# Patient Record
Sex: Male | Born: 1940 | Race: White | Hispanic: No | Marital: Married | Smoking: Former smoker
Health system: Southern US, Community
[De-identification: ages and names within clinical notes are randomized; demographics above are authoritative.]

## PROBLEM LIST (undated history)

## (undated) DIAGNOSIS — K8020 Calculus of gallbladder without cholecystitis without obstruction: Secondary | ICD-10-CM

## (undated) DIAGNOSIS — E785 Hyperlipidemia, unspecified: Secondary | ICD-10-CM

## (undated) DIAGNOSIS — N289 Disorder of kidney and ureter, unspecified: Secondary | ICD-10-CM

## (undated) DIAGNOSIS — T8859XA Other complications of anesthesia, initial encounter: Secondary | ICD-10-CM

## (undated) DIAGNOSIS — R809 Proteinuria, unspecified: Secondary | ICD-10-CM

## (undated) DIAGNOSIS — Z8619 Personal history of other infectious and parasitic diseases: Secondary | ICD-10-CM

## (undated) DIAGNOSIS — T4145XA Adverse effect of unspecified anesthetic, initial encounter: Secondary | ICD-10-CM

## (undated) DIAGNOSIS — I219 Acute myocardial infarction, unspecified: Secondary | ICD-10-CM

## (undated) DIAGNOSIS — N400 Enlarged prostate without lower urinary tract symptoms: Secondary | ICD-10-CM

## (undated) DIAGNOSIS — D649 Anemia, unspecified: Secondary | ICD-10-CM

## (undated) DIAGNOSIS — I779 Disorder of arteries and arterioles, unspecified: Secondary | ICD-10-CM

## (undated) DIAGNOSIS — I10 Essential (primary) hypertension: Secondary | ICD-10-CM

## (undated) DIAGNOSIS — I2510 Atherosclerotic heart disease of native coronary artery without angina pectoris: Secondary | ICD-10-CM

## (undated) DIAGNOSIS — I714 Abdominal aortic aneurysm, without rupture, unspecified: Secondary | ICD-10-CM

## (undated) DIAGNOSIS — E559 Vitamin D deficiency, unspecified: Secondary | ICD-10-CM

## (undated) DIAGNOSIS — M109 Gout, unspecified: Secondary | ICD-10-CM

## (undated) DIAGNOSIS — M722 Plantar fascial fibromatosis: Secondary | ICD-10-CM

## (undated) DIAGNOSIS — I739 Peripheral vascular disease, unspecified: Secondary | ICD-10-CM

## (undated) DIAGNOSIS — K219 Gastro-esophageal reflux disease without esophagitis: Secondary | ICD-10-CM

## (undated) DIAGNOSIS — M2020 Hallux rigidus, unspecified foot: Secondary | ICD-10-CM

## (undated) HISTORY — PX: EYE SURGERY: SHX253

## (undated) HISTORY — PX: CAROTID ENDARTERECTOMY: SUR193

## (undated) HISTORY — PX: FEMORAL ARTERY - FEMORAL ARTERY BYPASS GRAFT: SUR179

## (undated) HISTORY — PX: CORONARY ARTERY BYPASS GRAFT: SHX141

## (undated) HISTORY — PX: COLONOSCOPY, ESOPHAGOGASTRODUODENOSCOPY (EGD) AND ESOPHAGEAL DILATION: SHX5781

## (undated) HISTORY — PX: OTHER SURGICAL HISTORY: SHX169

## (undated) HISTORY — PX: COLONOSCOPY: SHX174

## (undated) HISTORY — PX: CORONARY ANGIOPLASTY: SHX604

---
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
## 2005-01-21 ENCOUNTER — Other Ambulatory Visit: Payer: Self-pay

## 2005-02-16 ENCOUNTER — Ambulatory Visit: Payer: Self-pay | Admitting: Otolaryngology

## 2006-08-22 ENCOUNTER — Ambulatory Visit: Payer: Self-pay | Admitting: Internal Medicine

## 2007-01-03 ENCOUNTER — Ambulatory Visit: Payer: Self-pay | Admitting: Gastroenterology

## 2007-09-19 ENCOUNTER — Ambulatory Visit: Payer: Self-pay | Admitting: Internal Medicine

## 2007-10-31 ENCOUNTER — Ambulatory Visit: Payer: Self-pay | Admitting: Unknown Physician Specialty

## 2007-12-02 ENCOUNTER — Emergency Department: Payer: Self-pay | Admitting: Unknown Physician Specialty

## 2007-12-02 ENCOUNTER — Other Ambulatory Visit: Payer: Self-pay

## 2009-10-28 ENCOUNTER — Ambulatory Visit: Payer: Self-pay | Admitting: Otolaryngology

## 2009-12-24 ENCOUNTER — Ambulatory Visit: Payer: Self-pay | Admitting: Internal Medicine

## 2010-02-10 ENCOUNTER — Ambulatory Visit: Payer: Self-pay | Admitting: Unknown Physician Specialty

## 2010-03-21 HISTORY — PX: CORONARY ARTERY BYPASS GRAFT: SHX141

## 2010-04-30 ENCOUNTER — Ambulatory Visit: Payer: Self-pay | Admitting: Vascular Surgery

## 2010-12-22 ENCOUNTER — Ambulatory Visit: Payer: Self-pay | Admitting: Cardiology

## 2011-03-11 ENCOUNTER — Ambulatory Visit: Payer: Self-pay | Admitting: Vascular Surgery

## 2011-03-18 ENCOUNTER — Inpatient Hospital Stay: Payer: Self-pay | Admitting: Vascular Surgery

## 2011-03-21 LAB — PATHOLOGY REPORT

## 2013-12-11 DIAGNOSIS — M722 Plantar fascial fibromatosis: Secondary | ICD-10-CM | POA: Insufficient documentation

## 2013-12-11 DIAGNOSIS — M109 Gout, unspecified: Secondary | ICD-10-CM | POA: Insufficient documentation

## 2013-12-11 DIAGNOSIS — E559 Vitamin D deficiency, unspecified: Secondary | ICD-10-CM | POA: Insufficient documentation

## 2013-12-11 DIAGNOSIS — I10 Essential (primary) hypertension: Secondary | ICD-10-CM | POA: Insufficient documentation

## 2013-12-11 DIAGNOSIS — I2510 Atherosclerotic heart disease of native coronary artery without angina pectoris: Secondary | ICD-10-CM | POA: Diagnosis present

## 2013-12-11 DIAGNOSIS — R809 Proteinuria, unspecified: Secondary | ICD-10-CM | POA: Insufficient documentation

## 2013-12-11 DIAGNOSIS — E785 Hyperlipidemia, unspecified: Secondary | ICD-10-CM | POA: Insufficient documentation

## 2013-12-11 DIAGNOSIS — N400 Enlarged prostate without lower urinary tract symptoms: Secondary | ICD-10-CM | POA: Insufficient documentation

## 2014-07-13 NOTE — Op Note (Signed)
PATIENT NAME:  Cory Howard, Cory Howard MR#:  [id number] DATE OF BIRTH:  1940/12/03  DATE OF PROCEDURE:  03/18/2011  PREOPERATIVE DIAGNOSES:  1. 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  2. Coronary artery disease, status post coronary bypass grafting.   POSTOPERATIVE DIAGNOSES: 1. 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  2. Coronary artery disease, status post coronary bypass grafting.   PROCEDURE: Left carotid endarterectomy.   SURGEON: Algernon Huxley, MD   ANESTHESIA: General.   ESTIMATED BLOOD LOSS: 25 mL.   INDICATION FOR PROCEDURE: The patient is a 74 year old white male with carotid artery stenosis. He has history of amaurosis fugax. He has had worsening ulcer and significant stenosis in the 70 to 75% range. With his previous history of symptoms, endarterectomy was discussed. He was found to have coronary disease and he has undergone coronary artery bypass grafting. He is now recovered from this and ready for surgery. Risks and benefits are discussed. Informed consent was obtained.   DESCRIPTION OF PROCEDURE: The patient was brought to the operative suite. After an adequate level of general anesthesia was obtained, he was placed in modified beach chair position. Roll was placed under his shoulders, neck was flexed and head turned to the right. The area was sterilely prepped and draped and a sterile surgical field was created. The incision was created along the anterior border of the sternocleidomastoid and we dissected down through the platysma with electrocautery. The facial vein was identified, ligated between silk ties as were several small branches. The artery was identified and was a generous size. The bifurcation was at the standard level but the disease did track several centimeters higher so we had to dissect out the hypoglossal nerve, remove all the surrounding vascular structures, ligate and divide those between silk ties to protect the hypoglossal  nerve. The common carotid artery, external carotid artery, superior thyroid artery, and internal carotid artery distal lesion were all dissected out and circled with vessel loops. The patient was given 6000 units of intravenous heparin systemic anticoagulation. Control was then pulled up on the vessel loops and an arteriotomy was created with an 11 blade and extended with Potts scissors. A Pruitt-Inahara shunt was then placed first in the internal carotid artery, flushed and then the common carotid artery flushed and de-aired and then flow was restored after approximately two minutes of clamp time. Endarterectomy was then performed in the usual fashion. The proximal endpoint was cut flush with tenotomy scissors. An eversion endarterectomy was performed in the external carotid artery and a nice feathered distal endpoint was created with blunt traction in the internal carotid artery. The distal endpoint was tacked down with two 7-0 Prolene sutures and all loose flecks were removed. The vessel was copiously heparinized locally. Due to the generous size of the artery, I elected to perform a primary closure. The distal endpoint was started first with a 6-0 Prolene suture, run to the midportion arteriotomy. The proximal endpoint was then started with 6-0 Prolene suture and run approximately one-quarter the length of the arteriotomy. At this point, the Pruitt-Inahara shunt was removed. The vessel was flushed in the external, internal, and common carotid arteries and heparinized locally and then the arteriotomy was completed flushing through the external carotid artery prior to tying the suture line down. Two 6-0 Prolene patch sutures were used for hemostasis. Several cardiac cycles were allowed to traverse up the external carotid artery before internal carotid artery control was released. On release of control, there was a  nice pulse in the distal internal carotid artery. The wound was copiously irrigated. Surgicel and  Evicyl topical hemostatic agents were placed and hemostasis was complete. The wound was closed with three interrupted 3-0 Vicryl sutures in the sternocleidomastoid space. Platysma was closed with a running 3-0 Vicryl. The skin was closed with 4-0 Monocryl. Sterile dressing was placed. The patient tolerated the procedure well and was taken to the recovery room in stable condition.   ____________________________ Algernon Huxley, MD jsd:drc D: 03/18/2011 16:13:53 ET T: 03/19/2011 10:08:04 ET JOB#: [id number]  cc: Algernon Huxley, MD, <Dictator> Javier Docker. Ubaldo Glassing, [place] Sarina Ser, MD Algernon Huxley MD ELECTRONICALLY SIGNED 04/08/2011 7:55

## 2015-02-19 DIAGNOSIS — Z86010 Personal history of colonic polyps: Secondary | ICD-10-CM | POA: Insufficient documentation

## 2015-03-19 ENCOUNTER — Encounter: Payer: Self-pay | Admitting: *Deleted

## 2015-03-20 ENCOUNTER — Ambulatory Visit: Payer: Medicare Other | Admitting: Certified Registered Nurse Anesthetist

## 2015-03-20 ENCOUNTER — Encounter: Payer: Self-pay | Admitting: *Deleted

## 2015-03-20 ENCOUNTER — Ambulatory Visit
Admission: RE | Admit: 2015-03-20 | Discharge: 2015-03-20 | Disposition: A | Discharge status: Home / Self Care | Payer: Medicare Other | Source: Ambulatory Visit | Attending: Unknown Physician Specialty | Admitting: Unknown Physician Specialty

## 2015-03-20 ENCOUNTER — Encounter
Admission: RE | Disposition: A | Discharge status: Home / Self Care | Payer: Self-pay | Source: Ambulatory Visit | Attending: Unknown Physician Specialty

## 2015-03-20 DIAGNOSIS — N289 Disorder of kidney and ureter, unspecified: Secondary | ICD-10-CM | POA: Diagnosis not present

## 2015-03-20 DIAGNOSIS — N400 Enlarged prostate without lower urinary tract symptoms: Secondary | ICD-10-CM | POA: Insufficient documentation

## 2015-03-20 DIAGNOSIS — D124 Benign neoplasm of descending colon: Secondary | ICD-10-CM | POA: Insufficient documentation

## 2015-03-20 DIAGNOSIS — I739 Peripheral vascular disease, unspecified: Secondary | ICD-10-CM | POA: Diagnosis not present

## 2015-03-20 DIAGNOSIS — E559 Vitamin D deficiency, unspecified: Secondary | ICD-10-CM | POA: Insufficient documentation

## 2015-03-20 DIAGNOSIS — D125 Benign neoplasm of sigmoid colon: Secondary | ICD-10-CM | POA: Diagnosis not present

## 2015-03-20 DIAGNOSIS — Z7982 Long term (current) use of aspirin: Secondary | ICD-10-CM | POA: Insufficient documentation

## 2015-03-20 DIAGNOSIS — Z79899 Other long term (current) drug therapy: Secondary | ICD-10-CM | POA: Diagnosis not present

## 2015-03-20 DIAGNOSIS — Z8249 Family history of ischemic heart disease and other diseases of the circulatory system: Secondary | ICD-10-CM | POA: Diagnosis not present

## 2015-03-20 DIAGNOSIS — I10 Essential (primary) hypertension: Secondary | ICD-10-CM | POA: Insufficient documentation

## 2015-03-20 DIAGNOSIS — Z86010 Personal history of colonic polyps: Secondary | ICD-10-CM | POA: Diagnosis present

## 2015-03-20 DIAGNOSIS — Z951 Presence of aortocoronary bypass graft: Secondary | ICD-10-CM | POA: Insufficient documentation

## 2015-03-20 DIAGNOSIS — Z87891 Personal history of nicotine dependence: Secondary | ICD-10-CM | POA: Insufficient documentation

## 2015-03-20 DIAGNOSIS — D123 Benign neoplasm of transverse colon: Secondary | ICD-10-CM | POA: Diagnosis not present

## 2015-03-20 DIAGNOSIS — Z801 Family history of malignant neoplasm of trachea, bronchus and lung: Secondary | ICD-10-CM | POA: Insufficient documentation

## 2015-03-20 DIAGNOSIS — Z885 Allergy status to narcotic agent status: Secondary | ICD-10-CM | POA: Diagnosis not present

## 2015-03-20 DIAGNOSIS — M109 Gout, unspecified: Secondary | ICD-10-CM | POA: Insufficient documentation

## 2015-03-20 DIAGNOSIS — R809 Proteinuria, unspecified: Secondary | ICD-10-CM | POA: Insufficient documentation

## 2015-03-20 DIAGNOSIS — I2510 Atherosclerotic heart disease of native coronary artery without angina pectoris: Secondary | ICD-10-CM | POA: Insufficient documentation

## 2015-03-20 DIAGNOSIS — E785 Hyperlipidemia, unspecified: Secondary | ICD-10-CM | POA: Diagnosis not present

## 2015-03-20 DIAGNOSIS — M722 Plantar fascial fibromatosis: Secondary | ICD-10-CM | POA: Insufficient documentation

## 2015-03-20 HISTORY — DX: Hyperlipidemia, unspecified: E78.5

## 2015-03-20 HISTORY — PX: COLONOSCOPY WITH PROPOFOL: SHX5780

## 2015-03-20 HISTORY — DX: Peripheral vascular disease, unspecified: I73.9

## 2015-03-20 HISTORY — DX: Vitamin D deficiency, unspecified: E55.9

## 2015-03-20 HISTORY — DX: Benign prostatic hyperplasia without lower urinary tract symptoms: N40.0

## 2015-03-20 HISTORY — DX: Gout, unspecified: M10.9

## 2015-03-20 HISTORY — DX: Proteinuria, unspecified: R80.9

## 2015-03-20 HISTORY — DX: Disorder of kidney and ureter, unspecified: N28.9

## 2015-03-20 HISTORY — DX: Atherosclerotic heart disease of native coronary artery without angina pectoris: I25.10

## 2015-03-20 HISTORY — DX: Disorder of arteries and arterioles, unspecified: I77.9

## 2015-03-20 HISTORY — DX: Essential (primary) hypertension: I10

## 2015-03-20 SURGERY — COLONOSCOPY WITH PROPOFOL
Anesthesia: General

## 2015-03-20 MED ORDER — SODIUM CHLORIDE 0.9 % IV SOLN: INTRAVENOUS | Status: DC

## 2015-03-20 MED ORDER — SODIUM CHLORIDE 0.9 % IV SOLN
INTRAVENOUS | Status: DC
  Administered 2015-03-20: 09:00:00 via INTRAVENOUS

## 2015-03-20 MED ORDER — PROPOFOL 10 MG/ML IV BOLUS
INTRAVENOUS | Status: DC | PRN
  Administered 2015-03-20: 30 mg via INTRAVENOUS

## 2015-03-20 MED ORDER — MIDAZOLAM HCL 2 MG/2ML IJ SOLN
INTRAMUSCULAR | Status: DC | PRN
  Administered 2015-03-20: 1 mg via INTRAVENOUS

## 2015-03-20 MED ORDER — PROPOFOL 500 MG/50ML IV EMUL
INTRAVENOUS | Status: DC | PRN
  Administered 2015-03-20: 140 ug/kg/min via INTRAVENOUS

## 2015-03-20 NOTE — Op Note (Signed)
[facility] Patient Name: Cory Howard Procedure Date: 03/20/2015 8:59 AM MRN: [id number] Account #: [id number] Date of Birth: 11-12-1940 Admit Type: Outpatient Age: 74 Room: [facility] ENDO ROOM 4 Gender: Male Note Status: Finalized Procedure:         Colonoscopy Indications:       High risk colon cancer surveillance: Personal history of                     colonic polyps Providers:         Manya Silvas, MD Referring MD:      Hewitt Blade. Sarina Ser, MD (Referring MD) Medicines:         Propofol per Anesthesia Complications:     No immediate complications. Procedure:         Pre-Anesthesia Assessment:                    - After reviewing the risks and benefits, the patient was                     deemed in satisfactory condition to undergo the procedure.                    After obtaining informed consent, the colonoscope was                     passed under direct vision. Throughout the procedure, the                     patient's blood pressure, pulse, and oxygen saturations                     were monitored continuously. The Colonoscope was                     introduced through the anus and advanced to the the cecum,                     identified by appendiceal orifice and ileocecal valve. The                     colonoscopy was performed without difficulty. The patient                     tolerated the procedure well. The quality of the bowel                     preparation was excellent. Findings:      A diminutive polyp was found in the sigmoid colon. The polyp was       sessile. The polyp was removed with a cold biopsy forceps. Resection and       retrieval were complete.      A diminutive polyp was found in the transverse colon. The polyp was       sessile. The polyp was removed with a cold biopsy forceps. Resection and       retrieval were complete.      Two sessile polyps were found in the descending colon. The polyps were   diminutive in size. These polyps were removed with a cold biopsy       forceps. Resection and retrieval were complete.      Two sessile polyps were found in the sigmoid colon. The polyps were       diminutive in  size. These polyps were removed with a cold biopsy       forceps. Resection and retrieval were complete.      The exam was otherwise without abnormality. Impression:        - One diminutive polyp in the sigmoid colon. Resected and                     retrieved.                    - One diminutive polyp in the transverse colon. Resected                     and retrieved.                    - Two diminutive polyps in the descending colon. Resected                     and retrieved.                    - Two diminutive polyps in the sigmoid colon. Resected and                     retrieved.                    - The examination was otherwise normal. Recommendation:    - Await pathology results. Manya Silvas, MD 03/20/2015 9:35:39 AM This report has been signed electronically. Number of Addenda: 0 Note Initiated On: 03/20/2015 8:59 AM Scope Withdrawal Time: 0 hours 12 minutes 32 seconds  Total Procedure Duration: 0 hours 19 minutes 40 seconds       [facility]

## 2015-03-20 NOTE — Anesthesia Procedure Notes (Signed)
Date/Time: 03/20/2015 9:06 AM Performed by: Johnna Acosta Pre-anesthesia Checklist: Patient identified, Emergency Drugs available, Suction available, Patient being monitored and Timeout performed Patient Re-evaluated:Patient Re-evaluated prior to inductionOxygen Delivery Method: Nasal cannula

## 2015-03-20 NOTE — Anesthesia Preprocedure Evaluation (Signed)
Anesthesia Evaluation  Patient identified by MRN, date of birth, ID band Patient awake    Reviewed: Allergy & Precautions, H&P , NPO status , Patient's Chart, lab work & pertinent test results  History of Anesthesia Complications (+) AWARENESS UNDER ANESTHESIA and history of anesthetic complications  Airway Mallampati: III  TM Distance: >3 FB Neck ROM: limited    Dental no notable dental hx. (+) Teeth Intact   Pulmonary neg shortness of breath, former smoker,    Pulmonary exam normal breath sounds clear to auscultation       Cardiovascular Exercise Tolerance: Good hypertension, (-) angina+ CAD, + CABG and + Peripheral Vascular Disease  (-) DOE Normal cardiovascular exam Rhythm:regular Rate:Normal     Neuro/Psych negative neurological ROS  negative psych ROS   GI/Hepatic negative GI ROS, Neg liver ROS,   Endo/Other  negative endocrine ROS  Renal/GU Renal disease  negative genitourinary   Musculoskeletal   Abdominal   Peds  Hematology negative hematology ROS (+)   Anesthesia Other Findings Past Medical History:   Hypertension                                                 Lipids serum increased                                       Gout                                                         Peripheral vascular disease (HCC)                            Coronary artery disease                                      BPH (benign prostatic hyperplasia)                           Renal insufficiency                                          Vitamin D deficiency                                         Gout                                                         Urine protein increased  Carotid arterial disease (HCC)                              Past Surgical History:   ADENATOMOUS COLON POLYP                                       FEMORAL ARTERY - FEMORAL ARTERY BYPASS GRAFT                   CORONARY ARTERY BYPASS GRAFT                                  CORONARY ANGIOPLASTY                                          COLONOSCOPY, ESOPHAGOGASTRODUODENOSCOPY (EGD) *               COLONOSCOPY                                                  BMI    Body Mass Index   29.96 kg/m 2    Patient has cardiac clearance for this procedure.     Reproductive/Obstetrics negative OB ROS                             Anesthesia Physical Anesthesia Plan  ASA: III  Anesthesia Plan: General   Post-op Pain Management:    Induction:   Airway Management Planned:   Additional Equipment:   Intra-op Plan:   Post-operative Plan:   Informed Consent: I have reviewed the patients History and Physical, chart, labs and discussed the procedure including the risks, benefits and alternatives for the proposed anesthesia with the patient or authorized representative who has indicated his/her understanding and acceptance.   Dental Advisory Given  Plan Discussed with: Anesthesiologist, CRNA and Surgeon  Anesthesia Plan Comments:         Anesthesia Quick Evaluation

## 2015-03-20 NOTE — Transfer of Care (Signed)
Immediate Anesthesia Transfer of Care Note  Patient: Cory Howard  Procedure(s) Performed: Procedure(s): COLONOSCOPY WITH PROPOFOL (N/A)  Patient Location: PACU  Anesthesia Type:General  Level of Consciousness: sedated  Airway & Oxygen Therapy: Patient Spontanous Breathing and Patient connected to nasal cannula oxygen  Post-op Assessment: Report given to RN and Post -op Vital signs reviewed and stable  Post vital signs: Reviewed and stable  Last Vitals:  Filed Vitals:   03/20/15 0834  BP: 154/80  Pulse: 61  Temp: 36.3 C  Resp: 18    Complications: No apparent anesthesia complications

## 2015-03-20 NOTE — Anesthesia Postprocedure Evaluation (Signed)
Anesthesia Post Note  Patient: Joshual Morale Coker  Procedure(s) Performed: Procedure(s) (LRB): COLONOSCOPY WITH PROPOFOL (N/A)  Patient location during evaluation: Endoscopy Anesthesia Type: General Level of consciousness: awake and alert Pain management: pain level controlled Vital Signs Assessment: post-procedure vital signs reviewed and stable Respiratory status: spontaneous breathing, nonlabored ventilation, respiratory function stable and patient connected to nasal cannula oxygen Cardiovascular status: blood pressure returned to baseline and stable Postop Assessment: no signs of nausea or vomiting Anesthetic complications: no    Last Vitals:  Filed Vitals:   03/20/15 0956 03/20/15 1006  BP: 110/82 133/76  Pulse: 53 52  Temp:    Resp: 16 15    Last Pain: There were no vitals filed for this visit.               Precious Haws Briana Newman

## 2015-03-20 NOTE — H&P (Signed)
Primary Care Physician:  Madelyn Brunner, MD Primary Gastroenterologist:  Dr. Vira Agar  Pre-Procedure History & Physical: HPI:  Cory Howard is a 74 y.o. male is here for an colonoscopy.   Past Medical History  Diagnosis Date  . Hypertension   . Lipids serum increased   . Gout   . Peripheral vascular disease ([facility])   . Coronary artery disease   . BPH (benign prostatic hyperplasia)   . Renal insufficiency   . Vitamin D deficiency   . Gout   . Urine protein increased   . Carotid arterial disease [facility])     Past Surgical History  Procedure Laterality Date  . Adenatomous colon polyp    . Femoral artery - femoral artery bypass graft    . Coronary artery bypass graft    . Coronary angioplasty    . Colonoscopy, esophagogastroduodenoscopy (egd) and esophageal dilation    . Colonoscopy      Prior to Admission medications   Medication Sig Start Date End Date Taking? Authorizing Provider  allopurinol (ZYLOPRIM) 300 MG tablet Take 300 mg by mouth daily.   Yes Historical Provider, MD  amLODipine (NORVASC) 5 MG tablet Take 5 mg by mouth daily.   Yes Historical Provider, MD  aspirin EC 81 MG tablet Take 81 mg by mouth daily.   Yes Historical Provider, MD  metoprolol tartrate (LOPRESSOR) 25 MG tablet Take 12.5 mg by mouth 2 (two) times daily.   Yes Historical Provider, MD  omega-3 acid ethyl esters (LOVAZA) 1 g capsule Take by mouth 2 (two) times daily.   Yes Historical Provider, MD  sildenafil (REVATIO) 20 MG tablet Take 20 mg by mouth 3 (three) times daily.   Yes Historical Provider, MD  simvastatin (ZOCOR) 40 MG tablet Take 40 mg by mouth daily.   Yes Historical Provider, MD  tamsulosin (FLOMAX) 0.4 MG CAPS capsule Take 0.4 mg by mouth.   Yes Historical Provider, MD    Allergies as of 02/25/2015  . (Not on File)    History reviewed. No pertinent family history.  Social History   Social History  . Marital Status: Married    Spouse Name: N/A  . Number of Children: N/A   . Years of Education: N/A   Occupational History  . Not on file.   Social History Main Topics  . Smoking status: Former Research scientist (life sciences)  . Smokeless tobacco: Never Used  . Alcohol Use: No  . Drug Use: No  . Sexual Activity: Not on file   Other Topics Concern  . Not on file   Social History Narrative    Review of Systems: See HPI, otherwise negative ROS  Physical Exam: BP 154/80 mmHg  Pulse 61  Temp(Src) 97.4 F (36.3 C) (Tympanic)  Resp 18  Ht 5\' 8"  (1.727 m)  Wt 89.359 kg (197 lb)  BMI 29.96 kg/m2  SpO2 96% General:   Alert,  pleasant and cooperative in NAD Head:  Normocephalic and atraumatic. Neck:  Supple; no masses or thyromegaly. Lungs:  Clear throughout to auscultation.    Heart:  Regular rate and rhythm. Abdomen:  Soft, nontender and nondistended. Normal bowel sounds, without guarding, and without rebound.   Neurologic:  Alert and  oriented x4;  grossly normal neurologically.  Impression/Plan: Cory Howard is here for an colonoscopy to be performed for [facility] colon polyps  Risks, benefits, limitations, and alternatives regarding  colonoscopy have been reviewed with the patient.  Questions have been answered.  All parties agreeable.  Gaylyn Cheers, MD  03/20/2015, 9:00 AM

## 2015-03-24 LAB — SURGICAL PATHOLOGY

## 2015-03-25 ENCOUNTER — Encounter: Payer: Self-pay | Admitting: Unknown Physician Specialty

## 2016-02-16 ENCOUNTER — Other Ambulatory Visit: Payer: Self-pay | Admitting: Cardiology

## 2016-02-16 MED ORDER — SODIUM CHLORIDE 0.9 % WEIGHT BASED INFUSION: 1.0000 mL/kg/h | INTRAVENOUS | Status: DC

## 2016-02-16 MED ORDER — SODIUM CHLORIDE 0.9 % WEIGHT BASED INFUSION: 3.0000 mL/kg/h | INTRAVENOUS | Status: DC

## 2016-02-16 MED ORDER — SODIUM CHLORIDE 0.9 % IV SOLN: 250.0000 mL | INTRAVENOUS | Status: AC | PRN

## 2016-02-16 MED ORDER — SODIUM CHLORIDE 0.9% FLUSH: 3.0000 mL | Freq: Two times a day (BID) | INTRAVENOUS | Status: AC

## 2016-02-16 MED ORDER — SODIUM CHLORIDE 0.9% FLUSH: 3.0000 mL | INTRAVENOUS | Status: AC | PRN

## 2016-02-16 MED ORDER — ASPIRIN 81 MG PO CHEW: 81.0000 mg | CHEWABLE_TABLET | ORAL | Status: AC

## 2016-02-23 ENCOUNTER — Ambulatory Visit
Admission: RE | Admit: 2016-02-23 | Discharge: 2016-02-23 | Disposition: A | Discharge status: Home / Self Care | Payer: Medicare Other | Source: Ambulatory Visit | Attending: Cardiology | Admitting: Cardiology

## 2016-02-23 ENCOUNTER — Encounter: Payer: Self-pay | Admitting: *Deleted

## 2016-02-23 ENCOUNTER — Encounter
Admission: RE | Disposition: A | Discharge status: Home / Self Care | Payer: Self-pay | Source: Ambulatory Visit | Attending: Cardiology

## 2016-02-23 DIAGNOSIS — I739 Peripheral vascular disease, unspecified: Secondary | ICD-10-CM | POA: Insufficient documentation

## 2016-02-23 DIAGNOSIS — I2582 Chronic total occlusion of coronary artery: Secondary | ICD-10-CM | POA: Diagnosis not present

## 2016-02-23 DIAGNOSIS — Z951 Presence of aortocoronary bypass graft: Secondary | ICD-10-CM | POA: Diagnosis not present

## 2016-02-23 DIAGNOSIS — Y832 Surgical operation with anastomosis, bypass or graft as the cause of abnormal reaction of the patient, or of later complication, without mention of misadventure at the time of the procedure: Secondary | ICD-10-CM | POA: Diagnosis not present

## 2016-02-23 DIAGNOSIS — E7800 Pure hypercholesterolemia, unspecified: Secondary | ICD-10-CM | POA: Diagnosis not present

## 2016-02-23 DIAGNOSIS — M109 Gout, unspecified: Secondary | ICD-10-CM | POA: Diagnosis not present

## 2016-02-23 DIAGNOSIS — F17210 Nicotine dependence, cigarettes, uncomplicated: Secondary | ICD-10-CM | POA: Diagnosis not present

## 2016-02-23 DIAGNOSIS — T829XXA Unspecified complication of cardiac and vascular prosthetic device, implant and graft, initial encounter: Secondary | ICD-10-CM | POA: Diagnosis not present

## 2016-02-23 DIAGNOSIS — I10 Essential (primary) hypertension: Secondary | ICD-10-CM | POA: Insufficient documentation

## 2016-02-23 DIAGNOSIS — Z95820 Peripheral vascular angioplasty status with implants and grafts: Secondary | ICD-10-CM | POA: Insufficient documentation

## 2016-02-23 DIAGNOSIS — N289 Disorder of kidney and ureter, unspecified: Secondary | ICD-10-CM | POA: Insufficient documentation

## 2016-02-23 DIAGNOSIS — N5203 Combined arterial insufficiency and corporo-venous occlusive erectile dysfunction: Secondary | ICD-10-CM | POA: Diagnosis not present

## 2016-02-23 DIAGNOSIS — I2510 Atherosclerotic heart disease of native coronary artery without angina pectoris: Secondary | ICD-10-CM | POA: Diagnosis not present

## 2016-02-23 DIAGNOSIS — Z79899 Other long term (current) drug therapy: Secondary | ICD-10-CM | POA: Diagnosis not present

## 2016-02-23 DIAGNOSIS — Z7982 Long term (current) use of aspirin: Secondary | ICD-10-CM | POA: Insufficient documentation

## 2016-02-23 HISTORY — PX: CARDIAC CATHETERIZATION: SHX172

## 2016-02-23 SURGERY — LEFT HEART CATH AND CORONARY ANGIOGRAPHY
Anesthesia: Moderate Sedation

## 2016-02-23 SURGERY — LEFT HEART CATH AND CORONARY ANGIOGRAPHY
Anesthesia: Moderate Sedation | Laterality: Right

## 2016-02-23 MED ORDER — SODIUM CHLORIDE 0.9% FLUSH: 3.0000 mL | Freq: Two times a day (BID) | INTRAVENOUS | Status: DC

## 2016-02-23 MED ORDER — FENTANYL CITRATE (PF) 100 MCG/2ML IJ SOLN
INTRAMUSCULAR | Status: AC
  Filled 2016-02-23: qty 2

## 2016-02-23 MED ORDER — FENTANYL CITRATE (PF) 100 MCG/2ML IJ SOLN
INTRAMUSCULAR | Status: DC | PRN
  Administered 2016-02-23: 25 ug via INTRAVENOUS

## 2016-02-23 MED ORDER — HEPARIN (PORCINE) IN NACL 2-0.9 UNIT/ML-% IJ SOLN
INTRAMUSCULAR | Status: AC
  Filled 2016-02-23: qty 500

## 2016-02-23 MED ORDER — NITROGLYCERIN 0.4 MG SL SUBL
SUBLINGUAL_TABLET | SUBLINGUAL | Status: AC
  Administered 2016-02-23: 0.4 mg via SUBLINGUAL
  Filled 2016-02-23: qty 1

## 2016-02-23 MED ORDER — ASPIRIN 81 MG PO CHEW: 81.0000 mg | CHEWABLE_TABLET | ORAL | Status: DC

## 2016-02-23 MED ORDER — SODIUM CHLORIDE 0.9 % IV SOLN: INTRAVENOUS | Status: DC

## 2016-02-23 MED ORDER — NITROGLYCERIN 0.4 MG SL SUBL
0.4000 mg | SUBLINGUAL_TABLET | Freq: Once | SUBLINGUAL | Status: AC
  Administered 2016-02-23: 0.4 mg via SUBLINGUAL

## 2016-02-23 MED ORDER — SODIUM CHLORIDE 0.9 % IV SOLN: 250.0000 mL | INTRAVENOUS | Status: DC | PRN

## 2016-02-23 MED ORDER — MIDAZOLAM HCL 2 MG/2ML IJ SOLN
INTRAMUSCULAR | Status: DC | PRN
  Administered 2016-02-23: 1 mg via INTRAVENOUS

## 2016-02-23 MED ORDER — SODIUM CHLORIDE 0.9% FLUSH: 3.0000 mL | INTRAVENOUS | Status: DC | PRN

## 2016-02-23 MED ORDER — MIDAZOLAM HCL 2 MG/2ML IJ SOLN
INTRAMUSCULAR | Status: AC
  Filled 2016-02-23: qty 2

## 2016-02-23 MED ORDER — IOPAMIDOL (ISOVUE-300) INJECTION 61%
INTRAVENOUS | Status: DC | PRN
Start: 2016-02-23 — End: 2016-02-23
  Administered 2016-02-23: 125 mL via INTRA_ARTERIAL

## 2016-02-23 SURGICAL SUPPLY — 13 items
CATH 5FR PIGTAIL DIAGNOSTIC (CATHETERS) ×4 IMPLANT
CATH INFINITI 5 FR IM (CATHETERS) ×4 IMPLANT
CATH INFINITI 5 FR MPA2 (CATHETERS) ×4 IMPLANT
CATH INFINITI 5FR JL4 (CATHETERS) ×4 IMPLANT
CATH INFINITI 5FR JL5 (CATHETERS) ×4 IMPLANT
CATH INFINITI JR4 5F (CATHETERS) ×4 IMPLANT
DEVICE CLOSURE MYNXGRIP 5F (Vascular Products) ×4 IMPLANT
GUIDEWIRE J TIP .035 (WIRE) ×4 IMPLANT
KIT MANI 3VAL PERCEP (MISCELLANEOUS) ×4 IMPLANT
NEEDLE PERC 18GX7CM (NEEDLE) ×4 IMPLANT
PACK CARDIAC CATH (CUSTOM PROCEDURE TRAY) ×4 IMPLANT
SHEATH AVANTI 5FR X 11CM (SHEATH) ×4 IMPLANT
WIRE EMERALD 3MM-J .035X150CM (WIRE) ×4 IMPLANT

## 2016-02-23 NOTE — Progress Notes (Signed)
Patient complaint of 2/10 chest tightness upon arrival to recovery area, MD notified with no new orders. At discharge time, patient complaint of 1/10 chest tightness, MD notified with nitro order. Nitro administered with no improvement in chest tightness, but with drop in BP. MD notified and gave order ok for discharge. Patient's vital signs stabilized after nitro administration. Left groin site, WNL. Discharge instructions reviewed with patient and family.

## 2016-02-23 NOTE — H&P (Signed)
Chief Complaint: Chief Complaint  Patient presents with  . 6 month follow up  abnormal stress test  Date of Service: 02/16/2016 Date of Birth: [birth year] PCP: Madelyn Brunner, MD  History of Present Illness: [name] is a 75 y.o.male patient who has a history hypertension and hyperlipidemia. He underwent a PCI of the distal RCA in 2000. In preparation for vascular surgery, functional study showed evidence of ischemia. Cardiac catheterization revealed three-vessel coronary disease. He was referred and underwent coronary artery bypass grafting in 2012 at [facility] with placement of a left internal mammary to the LAD, saphenous vein graft to the first diagonal, ramus intermedius and posterior lateral artery. He also has peripheral vascular disease status post fem-fem bypass in 1997. He has noticed exertional shortness of breath and chest tightness. Stress test showed an EF of 52% with evidence of mild inferior ischemia. Echocardiogram revealed an ejection fraction of greater than 55% with mild AI, mild MR and mild TR. Patient still has symptoms. We discussed risks and benefits of left cardiac catheterization. Will discuss with vascular surgery due to his lower extremity revascularization however previous cardiac catheterization was done from left femoral.  Past Medical and Surgical History  Past Medical History Past Medical History:  Diagnosis Date  . Esophagitis  . Gout, joint  . Hallux rigidus  . History of shingles  . Hx of adenomatous colonic polyps 02/19/2015  . Hyperplastic polyps of stomach  . Peripheral vascular disease (CMS-HCC)  . Plantar fascial fibromatosis   Past Surgical History He has a past surgical history that includes Femoral artery - femoral artery bypass graft (1997); CABG X 4 (2012); Coronary angioplasty (2000); egd (10/31/2007); Colonoscopy (08/18/1993); Colonoscopy (11/15/1996); Colonoscopy (11/27/2001); Colonoscopy (01/03/2007); Colonoscopy  (02/10/2010); and Colonoscopy (03/20/2015).   Medications and Allergies  Current Medications  Current Outpatient Prescriptions  Medication Sig Dispense Refill  . allopurinol (ZYLOPRIM) 300 MG tablet Take 1 tablet (300 mg total) by mouth once daily. 90 tablet 3  . amLODIPine (NORVASC) 5 MG tablet Take 1 tablet (5 mg total) by mouth once daily. 90 tablet 3  . aspirin 81 MG EC tablet Take 81 mg by mouth once daily.  Marland Kitchen garlic 1 mg Cap Take 1 capsule by mouth as directed.  . metoprolol tartrate (LOPRESSOR) 25 MG tablet Take 0.5 tablets (12.5 mg total) by mouth 2 (two) times daily. 90 tablet 3  . omega-3 fatty acids (FISH OIL) 300 mg Cap Take 1 capsule by mouth as directed.  Marland Kitchen omeprazole (PRILOSEC) 20 MG DR capsule Take 20 mg by mouth once daily. Reported on 04/09/2015  . sildenafil (REVATIO) 20 mg tablet TAKE 1 TABLET AS DIRECTED BY PHYSICIAN 3  . simvastatin (ZOCOR) 40 MG tablet Take 1 tablet (40 mg total) by mouth nightly. 90 tablet 3  . tamsulosin (FLOMAX) 0.4 mg capsule Take 1 capsule (0.4 mg total) by mouth once daily. 90 capsule 0   No current facility-administered medications for this visit.   Allergies: Morphine  Social and Family History  Social History reports that he quit smoking about 32 years ago. His smoking use included Cigarettes. He has a 45.00 pack-year smoking history. He has never used smokeless tobacco. He reports that he does not drink alcohol or use drugs.  Family History Family History  Problem Relation Age of Onset  . Heart disease Mother  . Lung cancer Mother  . Heart disease Father  . Coronary Artery Disease (Blocked arteries around heart) Father   Review of Systems  Review of Systems  Constitutional: Negative for chills, diaphoresis, fever, malaise/fatigue and weight loss.  HENT: Negative for congestion, ear discharge, hearing loss and tinnitus.  Eyes: Negative for blurred vision.  Respiratory: Negative for cough, hemoptysis, sputum production and  wheezing.  Cardiovascular: Negative for chest pain, palpitations, orthopnea, claudication, leg swelling and PND.  Gastrointestinal: Negative for abdominal pain, blood in stool, constipation, diarrhea, heartburn, melena, nausea and vomiting.  Genitourinary: Negative for dysuria, frequency, hematuria and urgency.  Musculoskeletal: Negative for back pain, falls, joint pain and myalgias.  Skin: Negative for itching and rash.  Neurological: Negative for dizziness, tingling, focal weakness, loss of consciousness, weakness and headaches.  Endo/Heme/Allergies: Negative for polydipsia. Does not bruise/bleed easily.  Psychiatric/Behavioral: Negative for depression, memory loss and substance abuse. The patient is not nervous/anxious.    Physical Examination   Vitals: BP 150/70  Pulse 60  Resp 12  Ht 172.7 cm (5\' 8" )  Wt 87.5 kg (193 lb)  BMI 29.35 kg/m  Ht:172.7 cm (5\' 8" ) Wt:87.5 kg (193 lb) [id number] surface area is 2.05 meters squared. Body mass index is 29.35 kg/m.  Wt Readings from Last 3 Encounters:  02/16/16 87.5 kg (193 lb)  12/24/15 90.3 kg (199 lb)  09/16/15 90.4 kg (199 lb 3.2 oz)   BP Readings from Last 3 Encounters:  02/16/16 150/70  12/24/15 162/84  09/16/15 138/82  general: Male in no acute distress  LUNGS Breath Sounds: Normal Percussion: Normal  CARDIOVASCULAR JVP CV wave: no HJR: no Elevation at 90 degrees: None Carotid Pulse: normal pulsation bilaterally Bruit: None Apex: apical impulse normal  Auscultation Rhythm: normal sinus rhythm S1: normal S2: normal Clicks: no Rub: no Murmurs: no murmurs  Gallop: None ABDOMEN Liver enlargement: no Pulsatile aorta: no Ascites: no Bruits: no  EXTREMITIES Clubbing: no Edema: trace to 1+ bilateral pedal edema Pulses: peripheral pulses symmetrical Femoral Bruits: no Amputation: no SKIN Rash: no Cyanosis: no Embolic phemonenon: no Bruising: no NEURO Alert and Oriented to person, place and time:  yes Non focal: yes LABS Last 3 CBC results: Lab Results  Component Value Date  WBC 6.8 12/17/2015  WBC 6.2 12/08/2014  WBC 7.6 12/04/2013   Lab Results  Component Value Date  HGB 15.3 12/17/2015  HGB 15.1 12/08/2014  HGB 15.1 12/04/2013   Lab Results  Component Value Date  HCT 42.7 12/17/2015  HCT 42.9 12/08/2014  HCT 41.5 12/04/2013   Lab Results  Component Value Date  PLT 147 (L) 12/17/2015  PLT 131 (L) 12/08/2014  PLT 136 (L) 12/04/2013   Lab Results  Component Value Date  CREATININE 1.7 (H) 12/17/2015  BUN 19 12/17/2015  NA 142 12/17/2015  K 4.3 12/17/2015  CL 103 12/17/2015  CO2 27.7 12/17/2015   Lab Results  Component Value Date  HGBA1C 6.2 (H) 12/17/2015   Lab Results  Component Value Date  HDL 28.8 (L) 12/17/2015  HDL 28.5 (L) 12/08/2014  HDL 27.5 (L) 12/04/2013   Lab Results  Component Value Date  LDLCALC 83 12/17/2015  LDLCALC 87 12/08/2014  LDLCALC 80 12/04/2013   Lab Results  Component Value Date  TRIG 219 (H) 12/17/2015  TRIG 176 12/08/2014  TRIG 197 12/04/2013   Lab Results  Component Value Date  ALT 18 12/17/2015  AST 16 12/17/2015  ALKPHOS 71 12/17/2015   Lab Results  Component Value Date  TSH 2.524 12/17/2015   Assessment and Plan   75 y.o. male with  ICD-10-CM ICD-9-CM  1. Coronary artery disease involving native coronary artery of native  heart without angina pectoris-patient has progressive chest pain. Functional study showed inferior ischemia. We will proceed with left heart cath via left femoral approach to evaluate for progression of disease. Further recommendations after this is completed. I25.10 414.01  2. Carotid artery disease, unspecified laterality-no evidence of progression of carotid disease. Continue with aspirin therapy. I77.9 447.9  3. Pure hypercholesterolemia-hyperlipidemia is controlled with simvastatin and diet. LDL goal of less than 100 is recommended current LDL is 83. Diet is stressed along with daily  exercise. E78.0 272.0  4. Essential hypertension-blood pressure is controlled with diltiazem and metoprolol. Will continue with this regimen and follow I10 401.9  5. Peripheral vascular disease I73.9 443.9  6. Renal insufficiency-creatinine is 1.6. Has seen Nephrology for this. N28.9 593.9  7. Combined arterial insufficiency and corporo-venous occlusive erectile dysfunction-is tolerating sildenafil without difficulty. N52.03 607.84   Return in about 1 week (around 02/23/2016).  These notes generated with voice recognition software. I apologize for typographical errors.  Sydnee Levans, MD    H and P reviewed. No change

## 2016-02-29 ENCOUNTER — Encounter: Payer: Self-pay | Admitting: Cardiology

## 2016-06-23 DIAGNOSIS — D696 Thrombocytopenia, unspecified: Secondary | ICD-10-CM | POA: Insufficient documentation

## 2016-09-26 ENCOUNTER — Other Ambulatory Visit (INDEPENDENT_AMBULATORY_CARE_PROVIDER_SITE_OTHER): Payer: Self-pay | Admitting: Vascular Surgery

## 2016-09-26 DIAGNOSIS — I739 Peripheral vascular disease, unspecified: Principal | ICD-10-CM

## 2016-09-26 DIAGNOSIS — I70203 Unspecified atherosclerosis of native arteries of extremities, bilateral legs: Secondary | ICD-10-CM

## 2016-09-26 DIAGNOSIS — I779 Disorder of arteries and arterioles, unspecified: Secondary | ICD-10-CM

## 2016-09-26 DIAGNOSIS — Z95828 Presence of other vascular implants and grafts: Secondary | ICD-10-CM

## 2016-09-27 ENCOUNTER — Ambulatory Visit (INDEPENDENT_AMBULATORY_CARE_PROVIDER_SITE_OTHER): Payer: Medicare Other

## 2016-09-27 ENCOUNTER — Other Ambulatory Visit (INDEPENDENT_AMBULATORY_CARE_PROVIDER_SITE_OTHER): Payer: Self-pay | Admitting: Vascular Surgery

## 2016-09-27 ENCOUNTER — Ambulatory Visit (INDEPENDENT_AMBULATORY_CARE_PROVIDER_SITE_OTHER): Payer: Medicare Other | Admitting: Vascular Surgery

## 2016-09-27 ENCOUNTER — Encounter (INDEPENDENT_AMBULATORY_CARE_PROVIDER_SITE_OTHER): Payer: Self-pay | Admitting: Vascular Surgery

## 2016-09-27 VITALS — BP 128/76 | HR 50 | Resp 16 | Wt 185.0 lb

## 2016-09-27 DIAGNOSIS — I779 Disorder of arteries and arterioles, unspecified: Secondary | ICD-10-CM

## 2016-09-27 DIAGNOSIS — I10 Essential (primary) hypertension: Secondary | ICD-10-CM | POA: Insufficient documentation

## 2016-09-27 DIAGNOSIS — I6529 Occlusion and stenosis of unspecified carotid artery: Secondary | ICD-10-CM | POA: Insufficient documentation

## 2016-09-27 DIAGNOSIS — I6523 Occlusion and stenosis of bilateral carotid arteries: Secondary | ICD-10-CM | POA: Diagnosis not present

## 2016-09-27 DIAGNOSIS — I70203 Unspecified atherosclerosis of native arteries of extremities, bilateral legs: Secondary | ICD-10-CM | POA: Diagnosis not present

## 2016-09-27 DIAGNOSIS — I70211 Atherosclerosis of native arteries of extremities with intermittent claudication, right leg: Secondary | ICD-10-CM | POA: Diagnosis not present

## 2016-09-27 DIAGNOSIS — Z95828 Presence of other vascular implants and grafts: Secondary | ICD-10-CM

## 2016-09-27 DIAGNOSIS — I739 Peripheral vascular disease, unspecified: Secondary | ICD-10-CM

## 2016-09-27 DIAGNOSIS — I70219 Atherosclerosis of native arteries of extremities with intermittent claudication, unspecified extremity: Secondary | ICD-10-CM | POA: Insufficient documentation

## 2016-09-27 NOTE — Assessment & Plan Note (Signed)
His noninvasive studies today demonstrate a patent left-to-right femoral to femoral bypass with no stenosis. His left ABI is 1.05 his right ABI is 0.99 which are stable. He may always have some hip and buttock claudication due to diminished pelvic flow after a femoral-femoral bypass, but this is not lifestyle limiting. Continue to monitor on an annual basis and continue aspirin and statin agent.

## 2016-09-27 NOTE — Assessment & Plan Note (Signed)
Left carotid artery endarterectomy remains widely patent and right carotid artery stenosis remains mild. Check annually. Continue aspirin and statin agent.

## 2016-09-27 NOTE — Assessment & Plan Note (Signed)
blood pressure control important in reducing the progression of atherosclerotic disease. On appropriate oral medications.  

## 2016-09-27 NOTE — Progress Notes (Signed)
MRN : [id number]  Cory Howard is a 76 y.o. (Jul 01, 1940) male who presents with chief complaint of  Chief Complaint  Patient presents with  . Re-evaluation  .  History of Present Illness: Patient returns in follow-up today of multiple vascular issues. He does still have mild to moderate hip the buttock claudication but this is basically unchanged or slightly better and has been for several years. He is 15-20 years status post left-to-right femoral to femoral bypass and has done well from that. He denies any open ulcerations or infection. He has no ischemic rest pain. His noninvasive studies today demonstrate a patent left-to-right femoral to femoral bypass with no stenosis. His left ABI is 1.05 his right ABI is 0.99 which are stable. He is also about 6 years status post left carotid endarterectomy for high-grade stenosis with visual symptoms. He denies any current focal neurologic symptoms of arm or leg weakness or numbness, speech or swallowing difficulty, or temporary monocular blindness. His carotid duplex demonstrates a widely patent left carotid endarterectomy without recurrent stenosis and stable, mild right carotid artery stenosis in the 1-39% range.   Current Outpatient Prescriptions  Medication Sig Dispense Refill  . allopurinol (ZYLOPRIM) 300 MG tablet Take 300 mg by mouth daily.    Marland Kitchen amLODipine (NORVASC) 5 MG tablet Take 5 mg by mouth daily.    Marland Kitchen aspirin EC 81 MG tablet Take 81 mg by mouth daily.    . metoprolol tartrate (LOPRESSOR) 25 MG tablet Take 12.5 mg by mouth 2 (two) times daily.    Marland Kitchen omega-3 acid ethyl esters (LOVAZA) 1 g capsule Take by mouth 2 (two) times daily.    . sildenafil (REVATIO) 20 MG tablet Take 20 mg by mouth 3 (three) times daily.    . simvastatin (ZOCOR) 40 MG tablet Take 40 mg by mouth daily.    . tamsulosin (FLOMAX) 0.4 MG CAPS capsule Take 0.4 mg by mouth.     No current facility-administered medications for this visit.    Facility-Administered  Medications Ordered in Other Visits  Medication Dose Route Frequency Provider Last Rate Last Dose  . 0.9 %  sodium chloride infusion  250 mL Intravenous PRN Teodoro Spray, MD      . sodium chloride flush (NS) 0.9 % injection 3 mL  3 mL Intravenous Q12H Teodoro Spray, MD      . sodium chloride flush (NS) 0.9 % injection 3 mL  3 mL Intravenous PRN Teodoro Spray, MD        Past Medical History:  Diagnosis Date  . BPH (benign prostatic hyperplasia)   . Carotid arterial disease ([place])   . Coronary artery disease   . Gout   . Gout   . Hypertension   . Lipids serum increased   . Peripheral vascular disease ([place])   . Renal insufficiency   . Urine protein increased   . Vitamin D deficiency     Past Surgical History:  Procedure Laterality Date  . ADENATOMOUS COLON POLYP    . CARDIAC CATHETERIZATION Left 02/23/2016   Procedure: Left Heart Cath and Coronary Angiography;  Surgeon: Teodoro Spray, MD;  Location: [facility];  Service: Cardiovascular;  Laterality: Left;  . CARDIAC CATHETERIZATION Right 02/23/2016   Procedure: Bypass Graft Angiography;  Surgeon: Teodoro Spray, MD;  Location: [facility];  Service: Cardiovascular;  Laterality: Right;  . CAROTID ENDARTERECTOMY Left   . COLONOSCOPY    . COLONOSCOPY WITH PROPOFOL N/A  03/20/2015   Procedure: COLONOSCOPY WITH PROPOFOL;  Surgeon: Manya Silvas, MD;  Location: [facility] ENDOSCOPY;  Service: Endoscopy;  Laterality: N/A;  . COLONOSCOPY, ESOPHAGOGASTRODUODENOSCOPY (EGD) AND ESOPHAGEAL DILATION    . CORONARY ANGIOPLASTY    . CORONARY ARTERY BYPASS GRAFT    . FEMORAL ARTERY - FEMORAL ARTERY BYPASS GRAFT      Social History Social History  Substance Use Topics  . Smoking status: Former Smoker    Packs/day: 1.00    Years: 41.00    Quit date: 02/22/1982  . Smokeless tobacco: Never Used  . Alcohol use No      Family History Family History  Problem Relation Age of Onset  . Cancer Mother   . Heart disease  Mother      Allergies  Allergen Reactions  . Morphine And Related Other (See Comments)    psychosis     REVIEW OF SYSTEMS (Negative unless checked)  Constitutional: [] Weight loss  [] Fever  [] Chills Cardiac: [] Chest pain   [] Chest pressure   [] Palpitations   [] Shortness of breath when laying flat   [] Shortness of breath at rest   [] Shortness of breath with exertion. Vascular:  [x] Pain in legs with walking   [] Pain in legs at rest   [] Pain in legs when laying flat   [x] Claudication   [] Pain in feet when walking  [] Pain in feet at rest  [] Pain in feet when laying flat   [] History of DVT   [] Phlebitis   [] Swelling in legs   [] Varicose veins   [] Non-healing ulcers Pulmonary:   [] Uses home oxygen   [] Productive cough   [] Hemoptysis   [] Wheeze  [] COPD   [] Asthma Neurologic:  [] Dizziness  [] Blackouts   [] Seizures   [] History of stroke   [] History of TIA  [] Aphasia   [] Temporary blindness   [] Dysphagia   [] Weakness or numbness in arms   [] Weakness or numbness in legs Musculoskeletal:  [x] Arthritis   [] Joint swelling   [] Joint pain   [] Low back pain Hematologic:  [] Easy bruising  [] Easy bleeding   [] Hypercoagulable state   [] Anemic  [] Hepatitis Gastrointestinal:  [] Blood in stool   [] Vomiting blood  [] Gastroesophageal reflux/heartburn   [] Difficulty swallowing. Genitourinary:  [] Chronic kidney disease   [] Difficult urination  [] Frequent urination  [] Burning with urination   [] Blood in urine Skin:  [] Rashes   [] Ulcers   [] Wounds Psychological:  [] History of anxiety   []  History of major depression.  Physical Examination  Vitals:   09/27/16 1121  BP: 128/76  Pulse: (!) 50  Resp: 16  Weight: 185 lb (83.9 kg)   Body mass index is 28.13 kg/m. Gen:  WD/WN, NAD Head: [place]/AT, No temporalis wasting. Ear/Nose/Throat: Hearing grossly intact, nares w/o erythema or drainage, trachea midline Eyes: Conjunctiva clear. Sclera non-icteric Neck: Supple.  No bruit or JVD.  Pulmonary:  Good air movement,  equal and clear to auscultation bilaterally.  Cardiac: RRR, normal S1, S2, no Murmurs, rubs or gallops. Vascular:  Vessel Right Left  Radial Palpable Palpable  Ulnar Palpable Palpable  Brachial Palpable Palpable  Carotid Palpable, without bruit Palpable, without bruit  Aorta Not palpable N/A  Femoral Palpable Palpable  Popliteal Palpable Palpable  PT Palpable Palpable  DP Palpable Palpable   Gastrointestinal: soft, non-tender/non-distended. No guarding/reflex.  Musculoskeletal: M/S 5/5 throughout.  No deformity or atrophy. no edema. Neurologic: CN 2-12 intact. Sensation grossly intact in extremities.  Symmetrical.  Speech is fluent. Motor exam as listed above. Psychiatric: Judgment intact, Mood & affect appropriate for pt's  clinical situation. Dermatologic: No rashes or ulcers noted.  No cellulitis or open wounds. Well healed left CEA incision.      CBC No results found for: WBC, HGB, HCT, MCV, PLT  BMET No results found for: NA, K, CL, CO2, GLUCOSE, BUN, CREATININE, CALCIUM, GFRNONAA, GFRAA CrCl cannot be calculated (No order found.).  COAG No results found for: INR, PROTIME  Radiology No results found.    Assessment/Plan Essential hypertension, benign blood pressure control important in reducing the progression of atherosclerotic disease. On appropriate oral medications.   Atherosclerosis of native arteries of extremity with intermittent claudication (HCC) His noninvasive studies today demonstrate a patent left-to-right femoral to femoral bypass with no stenosis. His left ABI is 1.05 his right ABI is 0.99 which are stable. He may always have some hip and buttock claudication due to diminished pelvic flow after a femoral-femoral bypass, but this is not lifestyle limiting. Continue to monitor on an annual basis and continue aspirin and statin agent.  Carotid stenosis Left carotid artery endarterectomy remains widely patent and right carotid artery stenosis remains  mild. Check annually. Continue aspirin and statin agent.    Leotis Pain, MD  09/27/2016 12:22 PM    This note was created with Dragon medical transcription system.  Any errors from dictation are purely unintentional

## 2017-04-07 ENCOUNTER — Encounter: Payer: Self-pay | Admitting: Urology

## 2017-04-07 ENCOUNTER — Ambulatory Visit (INDEPENDENT_AMBULATORY_CARE_PROVIDER_SITE_OTHER): Payer: Medicare Other | Admitting: Urology

## 2017-04-07 VITALS — BP 129/75 | HR 61 | Ht 69.0 in | Wt 198.0 lb

## 2017-04-07 DIAGNOSIS — R3129 Other microscopic hematuria: Secondary | ICD-10-CM | POA: Diagnosis not present

## 2017-04-07 LAB — URINALYSIS, COMPLETE
Bilirubin, UA: NEGATIVE
Glucose, UA: NEGATIVE
Leukocytes, UA: NEGATIVE
NITRITE UA: NEGATIVE
PH UA: 5.5 (ref 5.0–7.5)
Specific Gravity, UA: >1.03 — ABNORMAL HIGH (ref 1.005–1.030)
Urobilinogen, Ur: 0.2 mg/dL (ref 0.2–1.0)

## 2017-04-07 LAB — MICROSCOPIC EXAMINATION
EPITHELIAL CELLS (NON RENAL): NONE SEEN /HPF (ref 0–10)
WBC, UA: NONE SEEN /hpf (ref 0–?)
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
## 2017-04-12 ENCOUNTER — Ambulatory Visit
Admission: RE | Admit: 2017-04-12 | Discharge: 2017-04-12 | Disposition: A | Discharge status: Home / Self Care | Payer: Medicare Other | Source: Ambulatory Visit | Attending: Urology | Admitting: Urology

## 2017-04-12 DIAGNOSIS — N281 Cyst of kidney, acquired: Secondary | ICD-10-CM | POA: Insufficient documentation

## 2017-04-12 DIAGNOSIS — N400 Enlarged prostate without lower urinary tract symptoms: Secondary | ICD-10-CM | POA: Diagnosis not present

## 2017-04-12 DIAGNOSIS — R3129 Other microscopic hematuria: Secondary | ICD-10-CM | POA: Insufficient documentation

## 2017-04-20 ENCOUNTER — Encounter: Payer: Self-pay | Admitting: Urology

## 2017-04-20 ENCOUNTER — Ambulatory Visit (INDEPENDENT_AMBULATORY_CARE_PROVIDER_SITE_OTHER): Payer: Medicare Other | Admitting: Urology

## 2017-04-20 VITALS — BP 144/77 | HR 60 | Ht 69.0 in | Wt 198.5 lb

## 2017-04-20 DIAGNOSIS — R3129 Other microscopic hematuria: Secondary | ICD-10-CM | POA: Diagnosis not present

## 2017-04-20 LAB — URINALYSIS, COMPLETE
BILIRUBIN UA: NEGATIVE
GLUCOSE, UA: NEGATIVE
KETONES UA: NEGATIVE
Leukocytes, UA: NEGATIVE
NITRITE UA: NEGATIVE
Urobilinogen, Ur: 0.2 mg/dL (ref 0.2–1.0)
pH, UA: 5.5 (ref 5.0–7.5)

## 2017-04-20 LAB — MICROSCOPIC EXAMINATION: EPITHELIAL CELLS (NON RENAL): NONE SEEN /HPF (ref 0–10)

## 2017-04-20 MED ORDER — LIDOCAINE HCL 2 % EX GEL
1.0000 "application " | Freq: Once | CUTANEOUS | Status: AC
  Administered 2017-04-20: 1 via URETHRAL

## 2017-04-20 MED ORDER — CIPROFLOXACIN HCL 500 MG PO TABS
500.0000 mg | ORAL_TABLET | Freq: Once | ORAL | Status: AC
  Administered 2017-04-20: 500 mg via ORAL
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
## 2017-04-25 ENCOUNTER — Other Ambulatory Visit: Payer: Self-pay | Admitting: Urology

## 2017-09-29 ENCOUNTER — Ambulatory Visit (INDEPENDENT_AMBULATORY_CARE_PROVIDER_SITE_OTHER): Payer: Medicare Other | Admitting: Vascular Surgery

## 2017-09-29 ENCOUNTER — Encounter (INDEPENDENT_AMBULATORY_CARE_PROVIDER_SITE_OTHER): Payer: Medicare Other

## 2017-10-13 ENCOUNTER — Encounter (INDEPENDENT_AMBULATORY_CARE_PROVIDER_SITE_OTHER): Payer: Self-pay | Admitting: Vascular Surgery

## 2017-10-13 ENCOUNTER — Ambulatory Visit (INDEPENDENT_AMBULATORY_CARE_PROVIDER_SITE_OTHER): Payer: Medicare Other

## 2017-10-13 ENCOUNTER — Ambulatory Visit (INDEPENDENT_AMBULATORY_CARE_PROVIDER_SITE_OTHER): Payer: Medicare Other | Admitting: Vascular Surgery

## 2017-10-13 VITALS — BP 123/74 | HR 51 | Resp 13 | Ht 69.0 in | Wt 197.0 lb

## 2017-10-13 DIAGNOSIS — I70211 Atherosclerosis of native arteries of extremities with intermittent claudication, right leg: Secondary | ICD-10-CM

## 2017-10-13 DIAGNOSIS — I6523 Occlusion and stenosis of bilateral carotid arteries: Secondary | ICD-10-CM

## 2017-10-13 DIAGNOSIS — I10 Essential (primary) hypertension: Secondary | ICD-10-CM | POA: Diagnosis not present

## 2017-10-13 NOTE — Progress Notes (Signed)
MRN : [id number]  Cory Howard is a 77 y.o. ([birth year]) male who presents with chief complaint of  Chief Complaint  Patient presents with  . Follow-up    1 year ABI,Carotid and Arterial  .  History of Present Illness: Patient returns in follow-up of multiple vascular issues.  He is over a decade status post left to right femoral to femoral bypass for short distance claudication.  He does still have some hip and buttock claudication which is stable.  No new ulceration, infection, or rest pain. His noninvasive studies today demonstrate a patent left-to-right femoral to femoral bypass with no stenosis. His left ABI is 1.25 his right ABI is 1.01 which are stable. He is also about 6 or 7 years status post left carotid endarterectomy for high-grade stenosis.  He has no focal neurologic symptoms.  Carotid duplex today shows his endarterectomy site to be widely patent with stable, mild, right carotid artery stenosis of less than 40%  Current Outpatient Medications  Medication Sig Dispense Refill  . allopurinol (ZYLOPRIM) 300 MG tablet Take 300 mg by mouth daily.    Marland Kitchen aspirin EC 81 MG tablet Take 81 mg by mouth daily.    . finasteride (PROSCAR) 5 MG tablet Take 5 mg by mouth daily.  11  . lisinopril (PRINIVIL,ZESTRIL) 2.5 MG tablet Take 2.5 mg by mouth daily.  4  . metoprolol tartrate (LOPRESSOR) 25 MG tablet Take 12.5 mg by mouth 2 (two) times daily.    Marland Kitchen omega-3 acid ethyl esters (LOVAZA) 1 g capsule Take by mouth 2 (two) times daily.    . sildenafil (REVATIO) 20 MG tablet Take 20 mg by mouth 3 (three) times daily.    . simvastatin (ZOCOR) 40 MG tablet Take 40 mg by mouth daily.    . tamsulosin (FLOMAX) 0.4 MG CAPS capsule Take 0.4 mg by mouth.     No current facility-administered medications for this visit.    Facility-Administered Medications Ordered in Other Visits  Medication Dose Route Frequency Provider Last Rate Last Dose  . 0.9 %  sodium chloride infusion  250 mL Intravenous PRN  Teodoro Spray, MD      . sodium chloride flush (NS) 0.9 % injection 3 mL  3 mL Intravenous Q12H Teodoro Spray, MD      . sodium chloride flush (NS) 0.9 % injection 3 mL  3 mL Intravenous PRN Teodoro Spray, MD        Past Medical History:  Diagnosis Date  . BPH (benign prostatic hyperplasia)   . Carotid arterial disease ([place])   . Coronary artery disease   . Gout   . Gout   . Hypertension   . Lipids serum increased   . Peripheral vascular disease ([place])   . Renal insufficiency   . Urine protein increased   . Vitamin D deficiency     Past Surgical History:  Procedure Laterality Date  . ADENATOMOUS COLON POLYP    . CARDIAC CATHETERIZATION Left 02/23/2016   Procedure: Left Heart Cath and Coronary Angiography;  Surgeon: Teodoro Spray, MD;  Location: [place] [facility];  Service: Cardiovascular;  Laterality: Left;  . CARDIAC CATHETERIZATION Right 02/23/2016   Procedure: Bypass Graft Angiography;  Surgeon: Teodoro Spray, MD;  Location: [place] [facility];  Service: Cardiovascular;  Laterality: Right;  . CAROTID ENDARTERECTOMY Left   . COLONOSCOPY    . COLONOSCOPY WITH PROPOFOL N/A 03/20/2015   Procedure: COLONOSCOPY WITH PROPOFOL;  Surgeon: Gavin Pound  Vira Agar, MD;  Location: [place] [facility];  Service: Endoscopy;  Laterality: N/A;  . COLONOSCOPY, ESOPHAGOGASTRODUODENOSCOPY (EGD) AND ESOPHAGEAL DILATION    . CORONARY ANGIOPLASTY    . CORONARY ARTERY BYPASS GRAFT    . FEMORAL ARTERY - FEMORAL ARTERY BYPASS GRAFT      Social History  Substance Use Topics  . Smoking status: Former Smoker    Packs/day: 1.00    Years: 41.00    Quit date: 02/22/1982  . Smokeless tobacco: Never Used  . Alcohol use No      Family History      Family History  Problem Relation Age of Onset  . Cancer Mother   . Heart disease Mother           Allergies  Allergen Reactions  . Morphine And Related Other (See Comments)    psychosis     REVIEW OF SYSTEMS (Negative unless  checked)  Constitutional: [] Weight loss  [] Fever  [] Chills Cardiac: [] Chest pain   [] Chest pressure   [] Palpitations   [] Shortness of breath when laying flat   [] Shortness of breath at rest   [] Shortness of breath with exertion. Vascular:  [x] Pain in legs with walking   [] Pain in legs at rest   [] Pain in legs when laying flat   [x] Claudication   [] Pain in feet when walking  [] Pain in feet at rest  [] Pain in feet when laying flat   [] History of DVT   [] Phlebitis   [] Swelling in legs   [] Varicose veins   [] Non-healing ulcers Pulmonary:   [] Uses home oxygen   [] Productive cough   [] Hemoptysis   [] Wheeze  [] COPD   [] Asthma Neurologic:  [] Dizziness  [] Blackouts   [] Seizures   [] History of stroke   [] History of TIA  [] Aphasia   [] Temporary blindness   [] Dysphagia   [] Weakness or numbness in arms   [] Weakness or numbness in legs Musculoskeletal:  [x] Arthritis   [] Joint swelling   [] Joint pain   [] Low back pain Hematologic:  [] Easy bruising  [] Easy bleeding   [] Hypercoagulable state   [] Anemic  [] Hepatitis Gastrointestinal:  [] Blood in stool   [] Vomiting blood  [] Gastroesophageal reflux/heartburn   [] Difficulty swallowing. Genitourinary:  [] Chronic kidney disease   [] Difficult urination  [] Frequent urination  [] Burning with urination   [] Blood in urine Skin:  [] Rashes   [] Ulcers   [] Wounds Psychological:  [] History of anxiety   []  History of major depression.    Physical Examination  Vitals:   10/13/17 1132  BP: 123/74  Pulse: (!) 51  Resp: 13  Weight: 197 lb (89.4 kg)  Height: 5\' 9"  (1.753 m)   Body mass index is 29.09 kg/m. Gen:  WD/WN, NAD. Appears younger than stated age Head: [place]/AT, No temporalis wasting. Ear/Nose/Throat: Hearing grossly intact, nares w/o erythema or drainage, trachea midline Eyes: Conjunctiva clear. Sclera non-icteric Neck: Supple.  No bruit  Pulmonary:  Good air movement, equal and clear to auscultation bilaterally.  Cardiac: RRR, No JVD Vascular:  Vessel Right  Left  Radial Palpable Palpable                          PT 1+ Palpable Palpable  DP Palpable Palpable    Musculoskeletal: M/S 5/5 throughout.  No deformity or atrophy. No edema. Neurologic: CN 2-12 intact. Sensation grossly intact in extremities.  Symmetrical.  Speech is fluent. Motor exam as listed above. Psychiatric: Judgment intact, Mood & affect appropriate for pt's clinical situation. Dermatologic: No rashes or ulcers  noted.  No cellulitis or open wounds. Lymph : No Cervical, Axillary, or Inguinal lymphadenopathy.     CBC No results found for: WBC, HGB, HCT, MCV, PLT  BMET No results found for: NA, K, CL, CO2, GLUCOSE, BUN, CREATININE, CALCIUM, GFRNONAA, GFRAA CrCl cannot be calculated (No order found.).  COAG No results found for: INR, PROTIME  Radiology No results found.    Assessment/Plan Essential hypertension, benign blood pressure control important in reducing the progression of atherosclerotic disease. On appropriate oral medications.   Atherosclerosis of native arteries of extremity with intermittent claudication (HCC) His noninvasive studies today demonstrate a patent left-to-right femoral to femoral bypass with no stenosis. His left ABI is 1.25 his right ABI is 1.01 which are stable. He may always have some hip and buttock claudication due to diminished pelvic flow after a femoral-femoral bypass, but this is not lifestyle limiting. Continue to monitor on an annual basis and continue aspirin and statin agent.  Carotid stenosis Left carotid artery endarterectomy remains widely patent and right carotid artery stenosis remains mild. Check annually. Continue aspirin and statin agent.      Leotis Pain, MD  10/13/2017 12:18 PM    This note was created with Dragon medical transcription system.  Any errors from dictation are purely unintentional

## 2017-10-13 NOTE — Patient Instructions (Signed)

## 2018-04-20 ENCOUNTER — Ambulatory Visit: Payer: Medicare Other | Admitting: Urology

## 2018-05-02 ENCOUNTER — Encounter: Admission: RE | Payer: Self-pay | Source: Home / Self Care

## 2018-05-02 ENCOUNTER — Ambulatory Visit
Admission: RE | Admit: 2018-05-02 | Payer: Medicare Other | Source: Home / Self Care | Admitting: Unknown Physician Specialty

## 2018-05-02 SURGERY — COLONOSCOPY WITH PROPOFOL
Anesthesia: General

## 2018-10-16 ENCOUNTER — Other Ambulatory Visit: Payer: Self-pay

## 2018-10-16 ENCOUNTER — Ambulatory Visit (INDEPENDENT_AMBULATORY_CARE_PROVIDER_SITE_OTHER): Payer: Medicare Other

## 2018-10-16 ENCOUNTER — Encounter (INDEPENDENT_AMBULATORY_CARE_PROVIDER_SITE_OTHER): Payer: Self-pay | Admitting: Vascular Surgery

## 2018-10-16 ENCOUNTER — Encounter (INDEPENDENT_AMBULATORY_CARE_PROVIDER_SITE_OTHER): Payer: Self-pay

## 2018-10-16 ENCOUNTER — Ambulatory Visit (INDEPENDENT_AMBULATORY_CARE_PROVIDER_SITE_OTHER): Payer: Medicare Other | Admitting: Vascular Surgery

## 2018-10-16 VITALS — BP 129/74 | HR 50 | Resp 12 | Ht 69.0 in | Wt 196.0 lb

## 2018-10-16 DIAGNOSIS — I70211 Atherosclerosis of native arteries of extremities with intermittent claudication, right leg: Secondary | ICD-10-CM

## 2018-10-16 DIAGNOSIS — I10 Essential (primary) hypertension: Secondary | ICD-10-CM

## 2018-10-16 DIAGNOSIS — I6523 Occlusion and stenosis of bilateral carotid arteries: Secondary | ICD-10-CM

## 2018-10-16 DIAGNOSIS — Z87891 Personal history of nicotine dependence: Secondary | ICD-10-CM

## 2018-10-16 DIAGNOSIS — Z79899 Other long term (current) drug therapy: Secondary | ICD-10-CM

## 2018-10-16 NOTE — Assessment & Plan Note (Signed)
Duplex shows his left carotid endarterectomy is widely patent and his right side remains in the mild range at 1 to 39%.  No symptoms.  Continue to monitor with duplex.  I have offered him an annual follow-up but he would prefer every other year which is certainly reasonable at this point

## 2018-10-16 NOTE — Progress Notes (Signed)
MRN : [id number]  Cory Howard is a 78 y.o. (1940/08/13) male who presents with chief complaint of  Chief Complaint  Patient presents with  . Follow-up  .  History of Present Illness: Patient returns today in follow up of multiple vascular issues.  He is status post left carotid endarterectomy several years ago.  He also had a femoral to femoral bypass over 15 years ago.  He has some mild hip pain with ambulation bilaterally but this is not worsening.  He has no focal neurologic symptoms of cerebrovascular ischemia. Duplex shows his left carotid endarterectomy is widely patent and his right side remains in the mild range at 1 to 39%.  His ABIs are normal today at 1.03 on the right and 1.2 on the left with brisk waveforms and normal digital pressures.  His femoral to femoral bypass is widely patent.  Current Outpatient Medications  Medication Sig Dispense Refill  . allopurinol (ZYLOPRIM) 300 MG tablet Take 300 mg by mouth daily.    Marland Kitchen aspirin EC 81 MG tablet Take 81 mg by mouth daily.    . finasteride (PROSCAR) 5 MG tablet Take 5 mg by mouth daily.  11  . lisinopril (PRINIVIL,ZESTRIL) 2.5 MG tablet Take 2.5 mg by mouth daily.  4  . metoprolol tartrate (LOPRESSOR) 25 MG tablet Take 12.5 mg by mouth 2 (two) times daily.    Marland Kitchen omega-3 acid ethyl esters (LOVAZA) 1 g capsule Take by mouth 2 (two) times daily.    . simvastatin (ZOCOR) 40 MG tablet Take 40 mg by mouth daily.    . tamsulosin (FLOMAX) 0.4 MG CAPS capsule Take 0.4 mg by mouth.    . sildenafil (REVATIO) 20 MG tablet Take 20 mg by mouth 3 (three) times daily.     No current facility-administered medications for this visit.    Facility-Administered Medications Ordered in Other Visits  Medication Dose Route Frequency Provider Last Rate Last Dose  . 0.9 %  sodium chloride infusion  250 mL Intravenous PRN Teodoro Spray, MD      . sodium chloride flush (NS) 0.9 % injection 3 mL  3 mL Intravenous Q12H Teodoro Spray, MD      .  sodium chloride flush (NS) 0.9 % injection 3 mL  3 mL Intravenous PRN Teodoro Spray, MD        Past Medical History:  Diagnosis Date  . BPH (benign prostatic hyperplasia)   . Carotid arterial disease ([place])   . Coronary artery disease   . Gout   . Gout   . Hypertension   . Lipids serum increased   . Peripheral vascular disease ([place])   . Renal insufficiency   . Urine protein increased   . Vitamin D deficiency     Past Surgical History:  Procedure Laterality Date  . ADENATOMOUS COLON POLYP    . CARDIAC CATHETERIZATION Left 02/23/2016   Procedure: Left Heart Cath and Coronary Angiography;  Surgeon: Teodoro Spray, MD;  Location: [place] [facility];  Service: Cardiovascular;  Laterality: Left;  . CARDIAC CATHETERIZATION Right 02/23/2016   Procedure: Bypass Graft Angiography;  Surgeon: Teodoro Spray, MD;  Location: [place] [facility];  Service: Cardiovascular;  Laterality: Right;  . CAROTID ENDARTERECTOMY Left   . COLONOSCOPY    . COLONOSCOPY WITH PROPOFOL N/A 03/20/2015   Procedure: COLONOSCOPY WITH PROPOFOL;  Surgeon: Manya Silvas, MD;  Location: [facility];  Service: Endoscopy;  Laterality: N/A;  . COLONOSCOPY, ESOPHAGOGASTRODUODENOSCOPY (EGD)  AND ESOPHAGEAL DILATION    . CORONARY ANGIOPLASTY    . CORONARY ARTERY BYPASS GRAFT    . FEMORAL ARTERY - FEMORAL ARTERY BYPASS GRAFT     Social History  Substance Use Topics  . Smoking status: Former Smoker    Packs/day: 1.00    Years: 41.00    Quit date: 02/22/1982  . Smokeless tobacco: Never Used  . Alcohol use No      Family History      Family History  Problem Relation Age of Onset  . Cancer Mother   . Heart disease Mother           Allergies  Allergen Reactions  . Morphine And Related Other (See Comments)    psychosis     REVIEW OF SYSTEMS (Negative unless checked)  Constitutional: [] ?Weight loss  [] ?Fever  [] ?Chills Cardiac: [] ?Chest pain   [] ?Chest pressure   [] ?Palpitations    [] ?Shortness of breath when laying flat   [] ?Shortness of breath at rest   [] ?Shortness of breath with exertion. Vascular:  [x] ?Pain in legs with walking   [] ?Pain in legs at rest   [] ?Pain in legs when laying flat   [x] ?Claudication   [] ?Pain in feet when walking  [] ?Pain in feet at rest  [] ?Pain in feet when laying flat   [] ?History of DVT   [] ?Phlebitis   [] ?Swelling in legs   [] ?Varicose veins   [] ?Non-healing ulcers Pulmonary:   [] ?Uses home oxygen   [] ?Productive cough   [] ?Hemoptysis   [] ?Wheeze  [] ?COPD   [] ?Asthma Neurologic:  [] ?Dizziness  [] ?Blackouts   [] ?Seizures   [] ?History of stroke   [] ?History of TIA  [] ?Aphasia   [] ?Temporary blindness   [] ?Dysphagia   [] ?Weakness or numbness in arms   [] ?Weakness or numbness in legs Musculoskeletal:  [x] ?Arthritis   [] ?Joint swelling   [] ?Joint pain   [] ?Low back pain Hematologic:  [] ?Easy bruising  [] ?Easy bleeding   [] ?Hypercoagulable state   [] ?Anemic  [] ?Hepatitis Gastrointestinal:  [] ?Blood in stool   [] ?Vomiting blood  [] ?Gastroesophageal reflux/heartburn   [] ?Difficulty swallowing. Genitourinary:  [] ?Chronic kidney disease   [] ?Difficult urination  [] ?Frequent urination  [] ?Burning with urination   [] ?Blood in urine Skin:  [] ?Rashes   [] ?Ulcers   [] ?Wounds Psychological:  [] ?History of anxiety   [] ? History of major depression.    Physical Examination  BP 129/74 (BP Location: Left Arm, Patient Position: Sitting, Cuff Size: Normal)   Pulse (!) 50   Resp 12   Ht 5\' 9"  (1.753 m)   Wt 196 lb (88.9 kg)   BMI 28.94 kg/m  Gen:  WD/WN, NAD Head: [place]/AT, No temporalis wasting. Ear/Nose/Throat: Hearing grossly intact, nares w/o erythema or drainage Eyes: Conjunctiva clear. Sclera non-icteric Neck: Supple.  Trachea midline Pulmonary:  Good air movement, no use of accessory muscles.  Cardiac: RRR, no JVD Vascular:  Vessel Right Left  Radial Palpable Palpable                          PT Palpable Palpable  DP Palpable Palpable     Musculoskeletal: M/S 5/5 throughout.  No deformity or atrophy. No edema. Neurologic: Sensation grossly intact in extremities.  Symmetrical.  Speech is fluent.  Psychiatric: Judgment intact, Mood & affect appropriate for pt's clinical situation. Dermatologic: No rashes or ulcers noted.  No cellulitis or open wounds.       Labs No results found for this or any previous visit (from the past 2160 hour(s)).  Radiology No results found.  Assessment/Plan Essential hypertension, benign blood pressure control important in reducing the progression of atherosclerotic disease. On appropriate oral medications.  Carotid stenosis Duplex shows his left carotid endarterectomy is widely patent and his right side remains in the mild range at 1 to 39%.  No symptoms.  Continue to monitor with duplex.  I have offered him an annual follow-up but he would prefer every other year which is certainly reasonable at this point  Atherosclerosis of native arteries of extremity with intermittent claudication ([place]) His ABIs are normal today at 1.03 on the right and 1.2 on the left with brisk waveforms and normal digital pressures.  His femoral to femoral bypass is widely patent.  At this point, he would prefer an every other year evaluation which is certainly reasonable this far out from surgery.    Leotis Pain, MD  10/16/2018 11:39 AM    This note was created with Dragon medical transcription system.  Any errors from dictation are purely unintentional

## 2018-10-16 NOTE — Assessment & Plan Note (Signed)
His ABIs are normal today at 1.03 on the right and 1.2 on the left with brisk waveforms and normal digital pressures.  His femoral to femoral bypass is widely patent.  At this point, he would prefer an every other year evaluation which is certainly reasonable this far out from surgery.

## 2019-01-10 DIAGNOSIS — N1832 Chronic kidney disease, stage 3b: Secondary | ICD-10-CM | POA: Insufficient documentation

## 2019-01-10 DIAGNOSIS — R319 Hematuria, unspecified: Secondary | ICD-10-CM | POA: Insufficient documentation

## 2019-06-20 DIAGNOSIS — R911 Solitary pulmonary nodule: Secondary | ICD-10-CM

## 2019-06-20 HISTORY — DX: Solitary pulmonary nodule: R91.1

## 2019-06-21 ENCOUNTER — Other Ambulatory Visit: Payer: Self-pay

## 2019-06-21 ENCOUNTER — Emergency Department
Admission: EM | Admit: 2019-06-21 | Discharge: 2019-06-21 | Disposition: A | Discharge status: Home / Self Care | Payer: Medicare Other | Attending: Emergency Medicine | Admitting: Emergency Medicine

## 2019-06-21 ENCOUNTER — Emergency Department: Payer: Medicare Other

## 2019-06-21 DIAGNOSIS — M5126 Other intervertebral disc displacement, lumbar region: Secondary | ICD-10-CM | POA: Insufficient documentation

## 2019-06-21 DIAGNOSIS — Z87891 Personal history of nicotine dependence: Secondary | ICD-10-CM | POA: Diagnosis not present

## 2019-06-21 DIAGNOSIS — I10 Essential (primary) hypertension: Secondary | ICD-10-CM | POA: Diagnosis not present

## 2019-06-21 DIAGNOSIS — R911 Solitary pulmonary nodule: Secondary | ICD-10-CM | POA: Insufficient documentation

## 2019-06-21 DIAGNOSIS — Z951 Presence of aortocoronary bypass graft: Secondary | ICD-10-CM | POA: Insufficient documentation

## 2019-06-21 DIAGNOSIS — I714 Abdominal aortic aneurysm, without rupture, unspecified: Secondary | ICD-10-CM

## 2019-06-21 DIAGNOSIS — I2510 Atherosclerotic heart disease of native coronary artery without angina pectoris: Secondary | ICD-10-CM | POA: Insufficient documentation

## 2019-06-21 DIAGNOSIS — R202 Paresthesia of skin: Secondary | ICD-10-CM | POA: Diagnosis present

## 2019-06-21 LAB — BASIC METABOLIC PANEL
Anion gap: 11 (ref 5–15)
BUN: 33 mg/dL — ABNORMAL HIGH (ref 8–23)
CO2: 22 mmol/L (ref 22–32)
Calcium: 11 mg/dL — ABNORMAL HIGH (ref 8.9–10.3)
Chloride: 104 mmol/L (ref 98–111)
Creatinine, Ser: 2.18 mg/dL — ABNORMAL HIGH (ref 0.61–1.24)
GFR calc Af Amer: 32 mL/min — ABNORMAL LOW (ref >60)
GFR calc non Af Amer: 28 mL/min — ABNORMAL LOW (ref >60)
Glucose, Bld: 130 mg/dL — ABNORMAL HIGH (ref 70–99)
Potassium: 4.2 mmol/L (ref 3.5–5.1)
Sodium: 137 mmol/L (ref 135–145)

## 2019-06-21 LAB — CBC
HCT: 43.7 % (ref 39.0–52.0)
Hemoglobin: 15.8 g/dL (ref 13.0–17.0)
MCH: 32.8 pg (ref 26.0–34.0)
MCHC: 36.2 g/dL — ABNORMAL HIGH (ref 30.0–36.0)
MCV: 90.9 fL (ref 80.0–100.0)
Platelets: 162 10*3/uL (ref 150–400)
RBC: 4.81 MIL/uL (ref 4.22–5.81)
RDW: 13.4 % (ref 11.5–15.5)
WBC: 11.9 10*3/uL — ABNORMAL HIGH (ref 4.0–10.5)
nRBC: 0 % (ref 0.0–0.2)

## 2019-06-21 LAB — URINALYSIS, COMPLETE (UACMP) WITH MICROSCOPIC
Bacteria, UA: NONE SEEN
Bilirubin Urine: NEGATIVE
Glucose, UA: NEGATIVE mg/dL
Ketones, ur: NEGATIVE mg/dL
Leukocytes,Ua: NEGATIVE
Nitrite: NEGATIVE
Protein, ur: 100 mg/dL — AB
Specific Gravity, Urine: 1.018 (ref 1.005–1.030)
Squamous Epithelial / HPF: NONE SEEN (ref 0–5)
pH: 5 (ref 5.0–8.0)

## 2019-06-21 MED ORDER — DEXAMETHASONE SODIUM PHOSPHATE 10 MG/ML IJ SOLN
10.0000 mg | Freq: Once | INTRAMUSCULAR | Status: AC
  Administered 2019-06-21: 20:00:00 10 mg via INTRAVENOUS
  Filled 2019-06-21: qty 1

## 2019-06-21 MED ORDER — HYDROMORPHONE HCL 1 MG/ML IJ SOLN
0.5000 mg | Freq: Once | INTRAMUSCULAR | Status: AC
  Administered 2019-06-21: 0.5 mg via INTRAVENOUS
  Filled 2019-06-21: qty 1

## 2019-06-21 MED ORDER — PREDNISONE 10 MG (21) PO TBPK: ORAL_TABLET | Freq: Every day | ORAL | 0 refills | Status: DC

## 2019-06-21 MED ORDER — HYDROMORPHONE HCL 1 MG/ML IJ SOLN
0.5000 mg | Freq: Once | INTRAMUSCULAR | Status: AC
  Administered 2019-06-21: 0.5 mg via INTRAVENOUS

## 2019-06-21 MED ORDER — HYDROCODONE-ACETAMINOPHEN 5-325 MG PO TABS
2.0000 | ORAL_TABLET | Freq: Once | ORAL | Status: AC
  Administered 2019-06-21: 2 via ORAL
  Filled 2019-06-21: qty 2

## 2019-06-21 MED ORDER — HYDROCODONE-ACETAMINOPHEN 5-325 MG PO TABS: 1.0000 | ORAL_TABLET | Freq: Four times a day (QID) | ORAL | 0 refills | Status: DC | PRN

## 2019-06-21 NOTE — ED Triage Notes (Signed)
Pt comes via POV from home with c/o Left side pain that started today. Pt states it starts on the left side and runs down his leg. Pt states it feels numb.  Pt states back pain lower bilaterally. Pt states it messes with his walking. Pt states throbbing pain.  Pt states hx of blockage in right leg. Pt states this is similar pain.

## 2019-06-21 NOTE — ED Provider Notes (Signed)
[facility] Emergency Department Provider Note       Time seen: ----------------------------------------- 7:38 PM on 06/21/2019 -----------------------------------------   I have reviewed the triage vital signs and the nursing notes.  HISTORY   Chief Complaint left side pain and Back Pain   HPI Cory Howard is a 79 y.o. male with a history of coronary artery disease, carotid arterial disease, gout, hypertension, renal insufficiency, peripheral vascular disease who presents to the ED for left side pain that started today.  Patient states it starts on the left side and runs down his leg.  Patient states it feels like his leg is numb.  He has lower back pain that affects his walking, the pain is throbbing.  Past Medical History:  Diagnosis Date  . BPH (benign prostatic hyperplasia)   . Carotid arterial disease ([place])   . Coronary artery disease   . Gout   . Gout   . Hypertension   . Lipids serum increased   . Peripheral vascular disease ([place])   . Renal insufficiency   . Urine protein increased   . Vitamin D deficiency     Patient Active Problem List   Diagnosis Date Noted  . Essential hypertension, benign 09/27/2016  . Atherosclerosis of native arteries of extremity with intermittent claudication ([place]) 09/27/2016  . Carotid stenosis 09/27/2016  . CAD (coronary artery disease) 12/11/2013    Past Surgical History:  Procedure Laterality Date  . ADENATOMOUS COLON POLYP    . CARDIAC CATHETERIZATION Left 02/23/2016   Procedure: Left Heart Cath and Coronary Angiography;  Surgeon: Teodoro Spray, MD;  Location: [facility];  Service: Cardiovascular;  Laterality: Left;  . CARDIAC CATHETERIZATION Right 02/23/2016   Procedure: Bypass Graft Angiography;  Surgeon: Teodoro Spray, MD;  Location: [facility];  Service: Cardiovascular;  Laterality: Right;  . CAROTID ENDARTERECTOMY Left   . COLONOSCOPY    . COLONOSCOPY WITH PROPOFOL N/A  03/20/2015   Procedure: COLONOSCOPY WITH PROPOFOL;  Surgeon: Manya Silvas, MD;  Location: [facility] ENDOSCOPY;  Service: Endoscopy;  Laterality: N/A;  . COLONOSCOPY, ESOPHAGOGASTRODUODENOSCOPY (EGD) AND ESOPHAGEAL DILATION    . CORONARY ANGIOPLASTY    . CORONARY ARTERY BYPASS GRAFT    . FEMORAL ARTERY - FEMORAL ARTERY BYPASS GRAFT      Allergies Morphine and related  Social History Social History   Tobacco Use  . Smoking status: Former Smoker    Packs/day: 1.00    Years: 41.00    Pack years: 41.00    Quit date: 02/22/1982    Years since quitting: 37.3  . Smokeless tobacco: Never Used  Substance Use Topics  . Alcohol use: No  . Drug use: No    Review of Systems Constitutional: Negative for fever. Cardiovascular: Negative for chest pain. Respiratory: Negative for shortness of breath. Gastrointestinal: Positive for flank pain Musculoskeletal: Positive for back pain, leg pain Skin: Negative for rash. Neurological: Negative for headaches, focal weakness or numbness.  All systems negative/normal/unremarkable except as stated in the HPI  ____________________________________________   PHYSICAL EXAM:  VITAL SIGNS: ED Triage Vitals  Enc Vitals Group     BP 06/21/19 1649 (!) 167/95     Pulse Rate 06/21/19 1649 78     Resp 06/21/19 1649 18     Temp 06/21/19 1649 98.6 F (37 C)     Temp src --      SpO2 06/21/19 1649 99 %     Weight 06/21/19 1647 197 lb (89.4 kg)  Height 06/21/19 1647 5\' 8"  (1.727 m)     Head Circumference --      Peak Flow --      Pain Score 06/21/19 1647 6     Pain Loc --      Pain Edu? --      Excl. in [place]? --     Constitutional: Alert and oriented.  Mild distress from pain Eyes: Conjunctivae are normal. Normal extraocular movements. Cardiovascular: Normal rate, regular rhythm. No murmurs, rubs, or gallops.  Palpable and dopplerable pulses in the dorsalis pedis and posterior tibial Respiratory: Normal respiratory effort without tachypnea nor  retractions. Breath sounds are clear and equal bilaterally. No wheezes/rales/rhonchi. Gastrointestinal: Soft and nontender. Normal bowel sounds Musculoskeletal: Nontender with normal range of motion in extremities. No lower extremity tenderness nor edema. Neurologic:  Normal speech and language. No gross focal neurologic deficits are appreciated.  Positive left straight leg raise examination, normal sensation in the left leg. Skin:  Skin is warm, dry and intact. No rash noted. Psychiatric: Mood and affect are normal.  ____________________________________________  ED COURSE:  As part of my medical decision making, I reviewed the following data within the [place] History obtained from family if available, nursing notes, old chart and ekg, as well as notes from prior ED visits. Patient presented for flank pain, leg pain, we will assess with labs and imaging as indicated at this time.   Procedures  Cory Howard was evaluated in Emergency Department on 06/21/2019 for the symptoms described in the history of present illness. He was evaluated in the context of the global COVID-19 pandemic, which necessitated consideration that the patient might be at risk for infection with the SARS-CoV-2 virus that causes COVID-19. Institutional protocols and algorithms that pertain to the evaluation of patients at risk for COVID-19 are in a state of rapid change based on information released by regulatory bodies including the CDC and federal and state organizations. These policies and algorithms were followed during the patient's care in the ED.  ____________________________________________   LABS (pertinent positives/negatives)  Labs Reviewed  CBC - Abnormal; Notable for the following components:      Result Value   WBC 11.9 (*)    MCHC 36.2 (*)    All other components within normal limits  BASIC METABOLIC PANEL - Abnormal; Notable for the following components:   Glucose, Bld 130 (*)     BUN 33 (*)    Creatinine, Ser 2.18 (*)    Calcium 11.0 (*)    GFR calc non Af Amer 28 (*)    GFR calc Af Amer 32 (*)    All other components within normal limits  URINALYSIS, COMPLETE (UACMP) WITH MICROSCOPIC - Abnormal; Notable for the following components:   Color, Urine YELLOW (*)    APPearance CLEAR (*)    Hgb urine dipstick MODERATE (*)    Protein, ur 100 (*)    All other components within normal limits    RADIOLOGY Images were viewed by me  MRI lumbar spine/CT renal protocol IMPRESSION:  1. No CT findings in the abdomen or pelvis to explain the patient's  left flank pain.  2. There is a moderate to large amount of stool throughout the colon  could be seen in the setting of constipation.  3. There is a 3 cm infrarenal abdominal aortic aneurysm with  aneurysmal dilatation of the bilateral common iliac arteries.  Recommend followup by ultrasound in 3 years. This recommendation  follows ACR consensus  guidelines: White Paper of the ACR Incidental  Findings Committee II on Vascular Findings. J Am Coll Radiol 2013;  [id number]  4. The bladder wall is mildly thickened. This is favored to be  secondary to chronic outlet obstruction, however correlation with  urinalysis is recommended.  5. Cholelithiasis without evidence of acute cholecystitis.  6. Prostatomegaly.  7. There is a 5 mm pulmonary nodule at the inferior right lung base.  No follow-up needed if patient is low-risk. Non-contrast chest CT  can be considered in 12 months if patient is high-risk. This  recommendation follows the consensus statement: Guidelines for  Management of Incidental Pulmonary Nodules Detected on CT Images:  From the Fleischner Society 2017; Radiology 2017; 284:228-243.  8. There are degenerative changes throughout the lumbar spine with a  large posterior disc osteophyte complex at the L3-L4 level, likely  resulting in spinal canal stenosis.  9. Aortic Atherosclerosis (ICD10-I70.0).   IMPRESSION: Multilevel degenerative changes as detailed above. Most notably, there is marked canal and lateral recess stenosis at L3-L4 with a disc extrusion compresses the traversing left L3 nerve root above disc level. There is also significant right foraminal stenosis at L5-S1 primarily secondary to endplate and facet spurring and small facet synovial cyst. ____________________________________________   DIFFERENTIAL DIAGNOSIS   Sciatica, degenerative disc disease, renal colic, UTI, pyelonephritis, occult CVA, herniated disc  FINAL ASSESSMENT AND PLAN  Low back pain, hypercalcemia, abdominal aortic aneurysm, pulmonary nodule, herniated disc   Plan: The patient had presented for severe radicular low back pain. Patient's labs did reveal some chronic kidney disease but also surprisingly hypercalcemia. Patient's imaging revealed the above findings, and in addition an L3 nerve root compression.  Patient is having pain but no numbness, certainly has no signs of cauda equina syndrome.  He is received steroids and pain medicine and will be referred to neurosurgery for outpatient follow-up.   Laurence Aly, MD    Note: This note was generated in part or whole with voice recognition software. Voice recognition is usually quite accurate but there are transcription errors that can and very often do occur. I apologize for any typographical errors that were not detected and corrected.     Earleen Newport, MD 06/21/19 2240

## 2019-06-21 NOTE — ED Notes (Signed)
Pt to MRI

## 2019-06-28 DIAGNOSIS — M5416 Radiculopathy, lumbar region: Secondary | ICD-10-CM

## 2019-06-28 DIAGNOSIS — M5126 Other intervertebral disc displacement, lumbar region: Secondary | ICD-10-CM

## 2019-06-28 HISTORY — DX: Other intervertebral disc displacement, lumbar region: M51.26

## 2019-06-28 HISTORY — DX: Radiculopathy, lumbar region: M54.16

## 2019-07-02 ENCOUNTER — Other Ambulatory Visit: Payer: Self-pay | Admitting: Neurosurgery

## 2019-07-05 ENCOUNTER — Other Ambulatory Visit: Payer: Self-pay

## 2019-07-05 ENCOUNTER — Encounter
Admission: RE | Admit: 2019-07-05 | Discharge: 2019-07-05 | Disposition: A | Discharge status: Home / Self Care | Payer: Medicare Other | Source: Ambulatory Visit | Attending: Neurosurgery | Admitting: Neurosurgery

## 2019-07-05 DIAGNOSIS — Z01818 Encounter for other preprocedural examination: Secondary | ICD-10-CM | POA: Insufficient documentation

## 2019-07-05 HISTORY — DX: Abdominal aortic aneurysm, without rupture, unspecified: I71.40

## 2019-07-05 HISTORY — DX: Abdominal aortic aneurysm, without rupture: I71.4

## 2019-07-05 HISTORY — DX: Other complications of anesthesia, initial encounter: T88.59XA

## 2019-07-05 HISTORY — DX: Gastro-esophageal reflux disease without esophagitis: K21.9

## 2019-07-05 HISTORY — DX: Anemia, unspecified: D64.9

## 2019-07-05 HISTORY — DX: Calculus of gallbladder without cholecystitis without obstruction: K80.20

## 2019-07-05 NOTE — Patient Instructions (Addendum)
INSTRUCTIONS FOR SURGERY     Your surgery is scheduled for:   Monday, April 26TH     To find out your arrival time for the day of surgery,          please call [phone] between 1 pm and 3 pm on :  Friday, April 23RD     When you arrive for surgery, report to the [facility].       Do NOT stop on the first floor to register.    REMEMBER: Instructions that are not followed completely may result in serious medical risk,  up to and including death, or upon the discretion of your surgeon and anesthesiologist,            your surgery may need to be rescheduled.  __X__ 1. Do not eat food after midnight the night before your procedure.                    No gum, candy, lozenger, tic tacs, tums or hard candies.                  ABSOLUTELY NOTHING SOLID IN YOUR MOUTH AFTER MIDNIGHT                    You may drink unlimited clear liquids up to 2 hours before you are scheduled to arrive for surgery.                   Do not drink anything within those 2 hours unless you need to take medicine, then take the                   smallest amount you need.  Clear liquids include:  water, apple juice without pulp,                   any flavor Gatorade, Black coffee, black tea.  Sugar may be added but no dairy/ honey /lemon.                        Broth and jello is not considered a clear liquid.  __x__  2. On the morning of surgery, please brush your teeth with toothpaste and water. You may rinse with                  mouthwash if you wish but DO NOT SWALLOW TOOTHPASTE OR MOUTHWASH  __X___3. NO alcohol for 24 hours before or after surgery.  __x___ 4.  Do NOT smoke or use e-cigarettes for 24 HOURS PRIOR TO SURGERY.                      DO NOT Use any chewable tobacco products for at least 6 hours prior to surgery.  __x___ 5. If you start any new medication after this appointment and prior to surgery, please   Bring it with you on the day of surgery.  ___x__ 6. Notify your doctor if there is any change in your medical condition, such as fever,  infection, vomitting, diarrhea or any open sores.  __x___ 7.  USE the CHG SOAP as instructed, the night before surgery and the day of surgery.                   Once you have washed with this soap, do NOT use any of the following: Powders, perfumes                    or lotions. Please do not wear make up, hairpins, clips or nail polish. You MAY  wear deodorant.                   Men may shave their face and neck.  Women need to shave 48 hours prior to surgery.                   DO NOT wear ANY jewelry on the day of surgery. If there are rings that are too tight to                    remove easily, please address this prior to the surgery day. Piercings need to be removed.                                                                     NO METAL ON YOUR BODY.                    Do NOT bring any valuables.  If you came to Pre-Admit testing then you will not need license,                     insurance card or credit card.  If you will be staying overnight, please either leave your things in                     the car or have your family be responsible for these items.                     [facility] IS NOT RESPONSIBLE FOR BELONGINGS OR VALUABLES.  ___X__ 8. DO NOT wear contact lenses on surgery day.  You may not have dentures,                     Hearing aides, contacts or glasses in the operating room. These items can be                    Placed in the Recovery Room to receive immediately after surgery.  __x___ 9. IF YOU ARE SCHEDULED TO GO HOME ON THE SAME DAY, YOU MUST                   Have someone to drive you home and to stay with you  for the first 24 hours.                    Have an arrangement prior to arriving on surgery day.  ___x__ 10. Take the following medications on the morning of surgery with a sip of water:  1. ZYLOPRIM                     2. AMLODIPINE                     3. METOPROLOL                     4. PRILOSEC/OMEPRAZOLE                     5. FLOMAX                     6.  _____ 11.  Follow any instructions provided to you by your surgeon.                        Such as enema, clear liquid bowel prep  __X__  12. STOP  ASPIRIN AS OF: April 19TH(one week before surgery)                       THIS INCLUDES BC POWDERS / GOODIES POWDER  __x___ 13. STOP Anti-inflammatories as of: April 19th (one week before surgery)                      This includes IBUPROFEN / MOTRIN / ADVIL / ALEVE/ NAPROXYN                    YOU MAY TAKE TYLENOL ANY TIME PRIOR TO SURGERY.  _x___ 14.  Stop supplements until after surgery.                     This includes: LOVAZA                 You may continue taking Vitamin B12 / Vitamin D3 but do not take on the morning of surgery.  __x____17.  Continue to take the following medications but do not take on the morning of surgery:                            lisinopril  _x_____18. If staying overnight, please have appropriate shoes to wear to be able to walk around the unit.                   Wear clean and comfortable clothing to the hospital.  IF YOU BRING A CELL PHONE, PLEASE BRING A CHARGER. BRING PHONE NUMBERS FOR YOUR CONTACTS. HAVE A STOOL SOFTENER ON HAND FOR USE WHEN YOU GET HOME.

## 2019-07-09 ENCOUNTER — Encounter
Admission: RE | Admit: 2019-07-09 | Discharge: 2019-07-09 | Disposition: A | Discharge status: Home / Self Care | Payer: Medicare Other | Source: Ambulatory Visit | Attending: Neurosurgery | Admitting: Neurosurgery

## 2019-07-09 ENCOUNTER — Other Ambulatory Visit: Payer: Self-pay

## 2019-07-09 DIAGNOSIS — Z01818 Encounter for other preprocedural examination: Secondary | ICD-10-CM | POA: Diagnosis not present

## 2019-07-09 DIAGNOSIS — I10 Essential (primary) hypertension: Secondary | ICD-10-CM

## 2019-07-09 DIAGNOSIS — R001 Bradycardia, unspecified: Secondary | ICD-10-CM

## 2019-07-09 LAB — CBC
HCT: 40.2 % (ref 39.0–52.0)
Hemoglobin: 14.4 g/dL (ref 13.0–17.0)
MCH: 32.7 pg (ref 26.0–34.0)
MCHC: 35.8 g/dL (ref 30.0–36.0)
MCV: 91.4 fL (ref 80.0–100.0)
Platelets: 120 10*3/uL — ABNORMAL LOW (ref 150–400)
RBC: 4.4 MIL/uL (ref 4.22–5.81)
RDW: 13.5 % (ref 11.5–15.5)
WBC: 8 10*3/uL (ref 4.0–10.5)
nRBC: 0 % (ref 0.0–0.2)

## 2019-07-09 LAB — BASIC METABOLIC PANEL
Anion gap: 8 (ref 5–15)
BUN: 31 mg/dL — ABNORMAL HIGH (ref 8–23)
CO2: 25 mmol/L (ref 22–32)
Calcium: 10.5 mg/dL — ABNORMAL HIGH (ref 8.9–10.3)
Chloride: 105 mmol/L (ref 98–111)
Creatinine, Ser: 2.06 mg/dL — ABNORMAL HIGH (ref 0.61–1.24)
GFR calc Af Amer: 35 mL/min — ABNORMAL LOW (ref >60)
GFR calc non Af Amer: 30 mL/min — ABNORMAL LOW (ref >60)
Glucose, Bld: 123 mg/dL — ABNORMAL HIGH (ref 70–99)
Potassium: 4.4 mmol/L (ref 3.5–5.1)
Sodium: 138 mmol/L (ref 135–145)

## 2019-07-09 LAB — URINALYSIS, ROUTINE W REFLEX MICROSCOPIC
Bacteria, UA: NONE SEEN
Bilirubin Urine: NEGATIVE
Glucose, UA: NEGATIVE mg/dL
Hgb urine dipstick: NEGATIVE
Ketones, ur: NEGATIVE mg/dL
Leukocytes,Ua: NEGATIVE
Nitrite: NEGATIVE
Protein, ur: 100 mg/dL — AB
Specific Gravity, Urine: 1.013 (ref 1.005–1.030)
Squamous Epithelial / HPF: NONE SEEN (ref 0–5)
pH: 7 (ref 5.0–8.0)

## 2019-07-09 LAB — SURGICAL PCR SCREEN
MRSA, PCR: NEGATIVE
Staphylococcus aureus: POSITIVE — AB

## 2019-07-09 LAB — TYPE AND SCREEN
ABO/RH(D): O POS
Antibody Screen: NEGATIVE

## 2019-07-09 LAB — APTT: aPTT: 29 seconds (ref 24–36)

## 2019-07-09 LAB — PROTIME-INR
INR: 1 (ref 0.8–1.2)
Prothrombin Time: 13.5 seconds (ref 11.4–15.2)

## 2019-07-09 NOTE — Pre-Procedure Instructions (Addendum)
Pre-Admit Testing Provider Communication Note  Provider: Dr. Ubaldo Glassing, Cardiology  Notification Mode: Secure Chat  Reason: EKG  Response:    Additional Information: Noted. Placed on chart.  Signed: Beulah Gandy, RN

## 2019-07-11 ENCOUNTER — Other Ambulatory Visit: Payer: Self-pay

## 2019-07-11 ENCOUNTER — Other Ambulatory Visit
Admission: RE | Admit: 2019-07-11 | Discharge: 2019-07-11 | Disposition: A | Discharge status: Home / Self Care | Payer: Medicare Other | Source: Ambulatory Visit | Attending: Neurosurgery | Admitting: Neurosurgery

## 2019-07-11 DIAGNOSIS — Z20822 Contact with and (suspected) exposure to covid-19: Secondary | ICD-10-CM | POA: Diagnosis not present

## 2019-07-11 DIAGNOSIS — Z01812 Encounter for preprocedural laboratory examination: Secondary | ICD-10-CM | POA: Insufficient documentation

## 2019-07-11 LAB — SARS CORONAVIRUS 2 (TAT 6-24 HRS): SARS Coronavirus 2: NEGATIVE

## 2019-07-15 ENCOUNTER — Observation Stay
Admission: RE | Admit: 2019-07-15 | Discharge: 2019-07-16 | Disposition: A | Discharge status: Home / Self Care | Payer: Medicare Other | Attending: Neurosurgery | Admitting: Neurosurgery

## 2019-07-15 ENCOUNTER — Other Ambulatory Visit: Payer: Self-pay

## 2019-07-15 ENCOUNTER — Encounter: Payer: Self-pay | Admitting: Neurosurgery

## 2019-07-15 ENCOUNTER — Ambulatory Visit: Payer: Medicare Other | Admitting: Anesthesiology

## 2019-07-15 ENCOUNTER — Ambulatory Visit: Payer: Medicare Other

## 2019-07-15 ENCOUNTER — Encounter
Admission: RE | Disposition: A | Discharge status: Home / Self Care | Payer: Self-pay | Source: Home / Self Care | Attending: Neurosurgery

## 2019-07-15 DIAGNOSIS — N400 Enlarged prostate without lower urinary tract symptoms: Secondary | ICD-10-CM | POA: Diagnosis not present

## 2019-07-15 DIAGNOSIS — Z951 Presence of aortocoronary bypass graft: Secondary | ICD-10-CM | POA: Insufficient documentation

## 2019-07-15 DIAGNOSIS — Z79899 Other long term (current) drug therapy: Secondary | ICD-10-CM | POA: Diagnosis not present

## 2019-07-15 DIAGNOSIS — I739 Peripheral vascular disease, unspecified: Secondary | ICD-10-CM | POA: Diagnosis not present

## 2019-07-15 DIAGNOSIS — M48062 Spinal stenosis, lumbar region with neurogenic claudication: Principal | ICD-10-CM | POA: Insufficient documentation

## 2019-07-15 DIAGNOSIS — I2510 Atherosclerotic heart disease of native coronary artery without angina pectoris: Secondary | ICD-10-CM | POA: Insufficient documentation

## 2019-07-15 DIAGNOSIS — Z87891 Personal history of nicotine dependence: Secondary | ICD-10-CM | POA: Insufficient documentation

## 2019-07-15 DIAGNOSIS — M7138 Other bursal cyst, other site: Secondary | ICD-10-CM | POA: Insufficient documentation

## 2019-07-15 DIAGNOSIS — N1832 Chronic kidney disease, stage 3b: Secondary | ICD-10-CM | POA: Insufficient documentation

## 2019-07-15 DIAGNOSIS — Z885 Allergy status to narcotic agent status: Secondary | ICD-10-CM | POA: Diagnosis not present

## 2019-07-15 DIAGNOSIS — Z955 Presence of coronary angioplasty implant and graft: Secondary | ICD-10-CM | POA: Diagnosis not present

## 2019-07-15 DIAGNOSIS — K219 Gastro-esophageal reflux disease without esophagitis: Secondary | ICD-10-CM | POA: Diagnosis not present

## 2019-07-15 DIAGNOSIS — Z7982 Long term (current) use of aspirin: Secondary | ICD-10-CM | POA: Insufficient documentation

## 2019-07-15 DIAGNOSIS — I129 Hypertensive chronic kidney disease with stage 1 through stage 4 chronic kidney disease, or unspecified chronic kidney disease: Secondary | ICD-10-CM | POA: Insufficient documentation

## 2019-07-15 DIAGNOSIS — E559 Vitamin D deficiency, unspecified: Secondary | ICD-10-CM | POA: Insufficient documentation

## 2019-07-15 DIAGNOSIS — Z9889 Other specified postprocedural states: Secondary | ICD-10-CM

## 2019-07-15 DIAGNOSIS — M5116 Intervertebral disc disorders with radiculopathy, lumbar region: Secondary | ICD-10-CM | POA: Diagnosis not present

## 2019-07-15 DIAGNOSIS — E785 Hyperlipidemia, unspecified: Secondary | ICD-10-CM | POA: Diagnosis not present

## 2019-07-15 DIAGNOSIS — M109 Gout, unspecified: Secondary | ICD-10-CM | POA: Insufficient documentation

## 2019-07-15 DIAGNOSIS — Z8249 Family history of ischemic heart disease and other diseases of the circulatory system: Secondary | ICD-10-CM | POA: Diagnosis not present

## 2019-07-15 DIAGNOSIS — Z419 Encounter for procedure for purposes other than remedying health state, unspecified: Secondary | ICD-10-CM

## 2019-07-15 HISTORY — PX: LUMBAR LAMINECTOMY/DECOMPRESSION MICRODISCECTOMY: SHX5026

## 2019-07-15 LAB — ABO/RH: ABO/RH(D): O POS

## 2019-07-15 SURGERY — LUMBAR LAMINECTOMY/DECOMPRESSION MICRODISCECTOMY
Anesthesia: General | Laterality: Bilateral

## 2019-07-15 MED ORDER — PROPOFOL 10 MG/ML IV BOLUS
INTRAVENOUS | Status: DC | PRN
  Administered 2019-07-15: 100 mg via INTRAVENOUS

## 2019-07-15 MED ORDER — SENNA 8.6 MG PO TABS
1.0000 | ORAL_TABLET | Freq: Two times a day (BID) | ORAL | Status: DC
  Administered 2019-07-15 – 2019-07-16 (×2): 8.6 mg via ORAL
  Filled 2019-07-15 (×2): qty 1

## 2019-07-15 MED ORDER — ONDANSETRON HCL 4 MG/2ML IJ SOLN: 4.0000 mg | Freq: Four times a day (QID) | INTRAMUSCULAR | Status: DC | PRN

## 2019-07-15 MED ORDER — THROMBIN 5000 UNITS EX SOLR
CUTANEOUS | Status: DC | PRN
  Administered 2019-07-15: 5000 [IU] via TOPICAL

## 2019-07-15 MED ORDER — DEXAMETHASONE SODIUM PHOSPHATE 10 MG/ML IJ SOLN
INTRAMUSCULAR | Status: DC | PRN
  Administered 2019-07-15: 10 mg via INTRAVENOUS

## 2019-07-15 MED ORDER — THROMBIN 5000 UNITS EX SOLR
CUTANEOUS | Status: AC
  Filled 2019-07-15: qty 5000

## 2019-07-15 MED ORDER — TRAZODONE HCL 50 MG PO TABS
25.0000 mg | ORAL_TABLET | Freq: Every evening | ORAL | Status: DC | PRN
  Administered 2019-07-16: 01:00:00 25 mg via ORAL
  Filled 2019-07-15: qty 1

## 2019-07-15 MED ORDER — REMIFENTANIL HCL 1 MG IV SOLR
INTRAVENOUS | Status: DC | PRN
  Administered 2019-07-15: .1 ug/kg/min via INTRAVENOUS

## 2019-07-15 MED ORDER — AMLODIPINE BESYLATE 10 MG PO TABS
10.0000 mg | ORAL_TABLET | Freq: Every day | ORAL | Status: DC
  Administered 2019-07-16: 09:00:00 10 mg via ORAL
  Filled 2019-07-15: qty 1

## 2019-07-15 MED ORDER — SUGAMMADEX SODIUM 200 MG/2ML IV SOLN
INTRAVENOUS | Status: DC | PRN
  Administered 2019-07-15: 170 mg via INTRAVENOUS

## 2019-07-15 MED ORDER — SODIUM CHLORIDE 0.9% FLUSH: 3.0000 mL | INTRAVENOUS | Status: DC | PRN

## 2019-07-15 MED ORDER — CEFAZOLIN SODIUM-DEXTROSE 2-4 GM/100ML-% IV SOLN
INTRAVENOUS | Status: AC
  Filled 2019-07-15: qty 100

## 2019-07-15 MED ORDER — METHYLPREDNISOLONE ACETATE 40 MG/ML IJ SUSP
INTRAMUSCULAR | Status: AC
  Filled 2019-07-15: qty 1

## 2019-07-15 MED ORDER — SODIUM CHLORIDE 0.9% FLUSH: 3.0000 mL | Freq: Two times a day (BID) | INTRAVENOUS | Status: DC

## 2019-07-15 MED ORDER — SODIUM CHLORIDE 0.9 % IV SOLN: 250.0000 mL | INTRAVENOUS | Status: DC

## 2019-07-15 MED ORDER — LIDOCAINE HCL (CARDIAC) PF 100 MG/5ML IV SOSY
PREFILLED_SYRINGE | INTRAVENOUS | Status: DC | PRN
  Administered 2019-07-15: 80 mg via INTRAVENOUS

## 2019-07-15 MED ORDER — LIDOCAINE-EPINEPHRINE (PF) 1 %-1:200000 IJ SOLN
INTRAMUSCULAR | Status: AC
  Filled 2019-07-15: qty 30

## 2019-07-15 MED ORDER — METOPROLOL TARTRATE 25 MG PO TABS
12.5000 mg | ORAL_TABLET | Freq: Two times a day (BID) | ORAL | Status: DC
  Administered 2019-07-15 – 2019-07-16 (×2): 12.5 mg via ORAL
  Filled 2019-07-15 (×2): qty 1

## 2019-07-15 MED ORDER — PROPOFOL 500 MG/50ML IV EMUL
INTRAVENOUS | Status: AC
  Filled 2019-07-15: qty 50

## 2019-07-15 MED ORDER — FENTANYL CITRATE (PF) 100 MCG/2ML IJ SOLN
INTRAMUSCULAR | Status: AC
  Filled 2019-07-15: qty 2

## 2019-07-15 MED ORDER — TAMSULOSIN HCL 0.4 MG PO CAPS
0.4000 mg | ORAL_CAPSULE | Freq: Every day | ORAL | Status: DC
  Administered 2019-07-16: 09:00:00 0.4 mg via ORAL
  Filled 2019-07-15: qty 1

## 2019-07-15 MED ORDER — FENTANYL CITRATE (PF) 100 MCG/2ML IJ SOLN
25.0000 ug | INTRAMUSCULAR | Status: DC | PRN
  Administered 2019-07-15 (×3): 25 ug via INTRAVENOUS

## 2019-07-15 MED ORDER — REMIFENTANIL HCL 1 MG IV SOLR
INTRAVENOUS | Status: AC
  Filled 2019-07-15: qty 1000

## 2019-07-15 MED ORDER — GLYCOPYRROLATE 0.2 MG/ML IJ SOLN
INTRAMUSCULAR | Status: DC | PRN
  Administered 2019-07-15: .2 mg via INTRAVENOUS

## 2019-07-15 MED ORDER — ACETAMINOPHEN 500 MG PO TABS
1000.0000 mg | ORAL_TABLET | Freq: Four times a day (QID) | ORAL | Status: DC
  Administered 2019-07-15 – 2019-07-16 (×3): 1000 mg via ORAL
  Filled 2019-07-15 (×3): qty 2

## 2019-07-15 MED ORDER — HYDROMORPHONE HCL 1 MG/ML IJ SOLN: 0.5000 mg | INTRAMUSCULAR | Status: DC | PRN

## 2019-07-15 MED ORDER — MENTHOL 3 MG MT LOZG
1.0000 | LOZENGE | OROMUCOSAL | Status: DC | PRN
  Filled 2019-07-15: qty 9

## 2019-07-15 MED ORDER — ACETAMINOPHEN 10 MG/ML IV SOLN
INTRAVENOUS | Status: DC | PRN
  Administered 2019-07-15: 1000 mg via INTRAVENOUS

## 2019-07-15 MED ORDER — LACTATED RINGERS IV SOLN: INTRAVENOUS | Status: DC

## 2019-07-15 MED ORDER — PANTOPRAZOLE SODIUM 40 MG PO TBEC
40.0000 mg | DELAYED_RELEASE_TABLET | Freq: Every day | ORAL | Status: DC
  Administered 2019-07-16: 09:00:00 40 mg via ORAL
  Filled 2019-07-15: qty 1

## 2019-07-15 MED ORDER — FENTANYL CITRATE (PF) 100 MCG/2ML IJ SOLN
INTRAMUSCULAR | Status: DC | PRN
  Administered 2019-07-15 (×2): 25 ug via INTRAVENOUS
  Administered 2019-07-15: 50 ug via INTRAVENOUS

## 2019-07-15 MED ORDER — BISACODYL 5 MG PO TBEC: 5.0000 mg | DELAYED_RELEASE_TABLET | Freq: Every day | ORAL | Status: DC | PRN

## 2019-07-15 MED ORDER — SUCCINYLCHOLINE CHLORIDE 20 MG/ML IJ SOLN
INTRAMUSCULAR | Status: DC | PRN
  Administered 2019-07-15: 30 mg via INTRAVENOUS
  Administered 2019-07-15: 100 mg via INTRAVENOUS

## 2019-07-15 MED ORDER — SIMVASTATIN 20 MG PO TABS
40.0000 mg | ORAL_TABLET | Freq: Every day | ORAL | Status: DC
  Administered 2019-07-15: 19:00:00 40 mg via ORAL
  Filled 2019-07-15: qty 2

## 2019-07-15 MED ORDER — OXYCODONE HCL 5 MG PO TABS
5.0000 mg | ORAL_TABLET | ORAL | Status: DC | PRN
  Administered 2019-07-15: 19:00:00 5 mg via ORAL
  Filled 2019-07-15: qty 1

## 2019-07-15 MED ORDER — CEFAZOLIN SODIUM-DEXTROSE 2-4 GM/100ML-% IV SOLN
2.0000 g | Freq: Once | INTRAVENOUS | Status: AC
  Administered 2019-07-15: 08:00:00 2 g via INTRAVENOUS

## 2019-07-15 MED ORDER — METHOCARBAMOL 1000 MG/10ML IJ SOLN
500.0000 mg | Freq: Four times a day (QID) | INTRAVENOUS | Status: DC
  Administered 2019-07-15: 11:00:00 500 mg via INTRAVENOUS
  Filled 2019-07-15 (×7): qty 5

## 2019-07-15 MED ORDER — ALLOPURINOL 300 MG PO TABS
300.0000 mg | ORAL_TABLET | Freq: Every day | ORAL | Status: DC
  Administered 2019-07-16: 09:00:00 300 mg via ORAL
  Filled 2019-07-15: qty 1

## 2019-07-15 MED ORDER — FENTANYL CITRATE (PF) 100 MCG/2ML IJ SOLN
INTRAMUSCULAR | Status: AC
  Administered 2019-07-15: 11:00:00 25 ug via INTRAVENOUS
  Filled 2019-07-15: qty 2

## 2019-07-15 MED ORDER — PHENOL 1.4 % MT LIQD
1.0000 | OROMUCOSAL | Status: DC | PRN
  Filled 2019-07-15: qty 177

## 2019-07-15 MED ORDER — OXYCODONE HCL 5 MG PO TABS
10.0000 mg | ORAL_TABLET | ORAL | Status: DC | PRN
  Administered 2019-07-15: 10 mg via ORAL
  Filled 2019-07-15: qty 2

## 2019-07-15 MED ORDER — LISINOPRIL 5 MG PO TABS
2.5000 mg | ORAL_TABLET | Freq: Two times a day (BID) | ORAL | Status: DC
  Administered 2019-07-15 – 2019-07-16 (×2): 2.5 mg via ORAL
  Filled 2019-07-15 (×2): qty 1

## 2019-07-15 MED ORDER — ONDANSETRON HCL 4 MG/2ML IJ SOLN
INTRAMUSCULAR | Status: DC | PRN
  Administered 2019-07-15 (×2): 4 mg via INTRAVENOUS

## 2019-07-15 MED ORDER — ONDANSETRON HCL 4 MG PO TABS: 4.0000 mg | ORAL_TABLET | Freq: Four times a day (QID) | ORAL | Status: DC | PRN

## 2019-07-15 MED ORDER — ACETAMINOPHEN 325 MG PO TABS: 650.0000 mg | ORAL_TABLET | ORAL | Status: DC | PRN

## 2019-07-15 MED ORDER — SODIUM CHLORIDE 0.9 % IV SOLN
INTRAVENOUS | Status: DC | PRN
  Administered 2019-07-15: 25 ug/min via INTRAVENOUS

## 2019-07-15 MED ORDER — ROCURONIUM BROMIDE 100 MG/10ML IV SOLN
INTRAVENOUS | Status: DC | PRN
  Administered 2019-07-15: 10 mg via INTRAVENOUS

## 2019-07-15 MED ORDER — MAGNESIUM CITRATE PO SOLN
1.0000 | Freq: Once | ORAL | Status: DC | PRN
  Filled 2019-07-15: qty 296

## 2019-07-15 MED ORDER — ACETAMINOPHEN 650 MG RE SUPP: 650.0000 mg | RECTAL | Status: DC | PRN

## 2019-07-15 MED ORDER — POLYETHYLENE GLYCOL 3350 17 G PO PACK: 17.0000 g | PACK | Freq: Every day | ORAL | Status: DC | PRN

## 2019-07-15 MED ORDER — METHOCARBAMOL 500 MG PO TABS
500.0000 mg | ORAL_TABLET | Freq: Four times a day (QID) | ORAL | Status: DC
  Administered 2019-07-15 – 2019-07-16 (×3): 500 mg via ORAL
  Filled 2019-07-15 (×3): qty 1

## 2019-07-15 MED ORDER — BUPIVACAINE HCL (PF) 0.5 % IJ SOLN
INTRAMUSCULAR | Status: AC
  Filled 2019-07-15: qty 30

## 2019-07-15 MED ORDER — ONDANSETRON HCL 4 MG/2ML IJ SOLN: 4.0000 mg | Freq: Once | INTRAMUSCULAR | Status: DC | PRN

## 2019-07-15 MED ORDER — LIDOCAINE-EPINEPHRINE (PF) 1 %-1:200000 IJ SOLN
INTRAMUSCULAR | Status: DC | PRN
  Administered 2019-07-15: 1 mL

## 2019-07-15 MED ORDER — SEVOFLURANE IN SOLN
RESPIRATORY_TRACT | Status: AC
  Filled 2019-07-15: qty 250

## 2019-07-15 MED ORDER — EPHEDRINE SULFATE 50 MG/ML IJ SOLN
INTRAMUSCULAR | Status: DC | PRN
  Administered 2019-07-15 (×2): 5 mg via INTRAVENOUS
  Administered 2019-07-15: 10 mg via INTRAVENOUS

## 2019-07-15 MED ORDER — SODIUM CHLORIDE 0.9 % IV SOLN: INTRAVENOUS | Status: DC

## 2019-07-15 SURGICAL SUPPLY — 58 items
BUR NEURO DRILL SOFT 3.0X3.8M (BURR) ×3 IMPLANT
CANISTER SUCT 1200ML W/VALVE (MISCELLANEOUS) ×6 IMPLANT
CHLORAPREP W/TINT 26 (MISCELLANEOUS) ×6 IMPLANT
COUNTER NEEDLE 20/40 LG (NEEDLE) ×3 IMPLANT
COVER LIGHT HANDLE STERIS (MISCELLANEOUS) ×6 IMPLANT
COVER WAND RF STERILE (DRAPES) ×3 IMPLANT
CUP MEDICINE 2OZ PLAST GRAD ST (MISCELLANEOUS) ×3 IMPLANT
DERMABOND ADVANCED (GAUZE/BANDAGES/DRESSINGS) ×2
DERMABOND ADVANCED .7 DNX12 (GAUZE/BANDAGES/DRESSINGS) ×1 IMPLANT
DRAPE C-ARM 42X72 X-RAY (DRAPES) ×6 IMPLANT
DRAPE LAPAROTOMY 100X77 ABD (DRAPES) ×3 IMPLANT
DRAPE MICROSCOPE SPINE 48X150 (DRAPES) IMPLANT
DRAPE SURG 17X11 SM STRL (DRAPES) ×3 IMPLANT
DRSG TEGADERM 4X4.75 (GAUZE/BANDAGES/DRESSINGS) IMPLANT
DRSG TELFA 4X3 1S NADH ST (GAUZE/BANDAGES/DRESSINGS) IMPLANT
DURASEAL APPLICATOR TIP (TIP) IMPLANT
DURASEAL SPINE SEALANT 3ML (MISCELLANEOUS) IMPLANT
ELECT CAUTERY BLADE TIP 2.5 (TIP) ×3
ELECT EZSTD 165MM 6.5IN (MISCELLANEOUS) ×3
ELECT REM PT RETURN 9FT ADLT (ELECTROSURGICAL) ×3
ELECTRODE CAUTERY BLDE TIP 2.5 (TIP) ×1 IMPLANT
ELECTRODE EZSTD 165MM 6.5IN (MISCELLANEOUS) ×1 IMPLANT
ELECTRODE REM PT RTRN 9FT ADLT (ELECTROSURGICAL) ×1 IMPLANT
GAUZE 4X4 16PLY RFD (DISPOSABLE) ×3 IMPLANT
GAUZE SPONGE 4X4 12PLY STRL (GAUZE/BANDAGES/DRESSINGS) ×3 IMPLANT
GLOVE BIOGEL PI IND STRL 7.0 (GLOVE) ×1 IMPLANT
GLOVE BIOGEL PI INDICATOR 7.0 (GLOVE) ×2
GLOVE INDICATOR 8.0 STRL GRN (GLOVE) ×9 IMPLANT
GLOVE SURG SYN 7.0 (GLOVE) ×6 IMPLANT
GLOVE SURG SYN 8.0 (GLOVE) ×3 IMPLANT
GOWN STRL REUS W/ TWL XL LVL3 (GOWN DISPOSABLE) ×1 IMPLANT
GOWN STRL REUS W/TWL MED LVL3 (GOWN DISPOSABLE) ×3 IMPLANT
GOWN STRL REUS W/TWL XL LVL3 (GOWN DISPOSABLE) ×2
GRADUATE 1200CC STRL 31836 (MISCELLANEOUS) ×3 IMPLANT
IV CATH ANGIO 12GX3 LT BLUE (NEEDLE) ×3 IMPLANT
KIT TURNOVER KIT A (KITS) ×3 IMPLANT
KIT WILSON FRAME (KITS) ×3 IMPLANT
KNIFE BAYONET SHORT DISCETOMY (MISCELLANEOUS) ×3 IMPLANT
MARKER SKIN DUAL TIP RULER LAB (MISCELLANEOUS) ×6 IMPLANT
NDL SAFETY ECLIPSE 18X1.5 (NEEDLE) ×1 IMPLANT
NEEDLE HYPO 18GX1.5 SHARP (NEEDLE) ×2
NEEDLE HYPO 22GX1.5 SAFETY (NEEDLE) ×3 IMPLANT
NS IRRIG 1000ML POUR BTL (IV SOLUTION) ×3 IMPLANT
PACK LAMINECTOMY NEURO (CUSTOM PROCEDURE TRAY) ×3 IMPLANT
PAD ARMBOARD 7.5X6 YLW CONV (MISCELLANEOUS) ×3 IMPLANT
SPOGE SURGIFLO 8M (HEMOSTASIS) ×2
SPONGE SURGIFLO 8M (HEMOSTASIS) ×1 IMPLANT
STAPLER SKIN PROX 35W (STAPLE) IMPLANT
SUT NURALON 4 0 TR CR/8 (SUTURE) IMPLANT
SUT POLYSORB 2-0 5X18 GS-10 (SUTURE) ×9 IMPLANT
SUT VIC AB 0 CT1 18XCR BRD 8 (SUTURE) ×2 IMPLANT
SUT VIC AB 0 CT1 8-18 (SUTURE) ×4
SYR 10ML LL (SYRINGE) ×6 IMPLANT
SYR 30ML LL (SYRINGE) ×3 IMPLANT
SYR 3ML LL SCALE MARK (SYRINGE) IMPLANT
TOWEL OR 17X26 4PK STRL BLUE (TOWEL DISPOSABLE) ×12 IMPLANT
TUBING CONNECTING 10 (TUBING) ×2 IMPLANT
TUBING CONNECTING 10' (TUBING) ×1

## 2019-07-15 NOTE — Transfer of Care (Signed)
Immediate Anesthesia Transfer of Care Note  Patient: Cory Howard  Procedure(s) Performed: OPEN L4 LAMINECTOMY, L3/4 DISCECTOMY (Bilateral )  Patient Location: PACU  Anesthesia Type:General  Level of Consciousness: awake and sedated  Airway & Oxygen Therapy: Patient Spontanous Breathing and Patient connected to face mask oxygen  Post-op Assessment: Report given to RN and Post -op Vital signs reviewed and stable  Post vital signs: Reviewed and stable  Last Vitals:  Vitals Value Taken Time  BP 119/82 07/15/19 1056  Temp 35.8 C 07/15/19 1056  Pulse 62 07/15/19 1101  Resp 19 07/15/19 1101  SpO2 100 % 07/15/19 1101  Vitals shown include unvalidated device data.  Last Pain:  Vitals:   07/15/19 1056  TempSrc:   PainSc: Asleep         Complications: No apparent anesthesia complications

## 2019-07-15 NOTE — Anesthesia Procedure Notes (Addendum)
Procedure Name: Intubation Date/Time: 07/15/2019 7:45 AM Performed by: Nelda Marseille, CRNA Pre-anesthesia Checklist: Patient identified, Patient being monitored, Timeout performed, Emergency Drugs available and Suction available Patient Re-evaluated:Patient Re-evaluated prior to induction Oxygen Delivery Method: Circle system utilized Preoxygenation: Pre-oxygenation with 100% oxygen Induction Type: IV induction Ventilation: Mask ventilation without difficulty Laryngoscope Size: Mac, 3 and McGraph Grade View: Grade II Tube type: Oral Tube size: 7.5 mm Number of attempts: 1 Airway Equipment and Method: Stylet Placement Confirmation: ETT inserted through vocal cords under direct vision,  positive ETCO2 and breath sounds checked- equal and bilateral Secured at: 21 cm Tube secured with: Tape Dental Injury: Teeth and Oropharynx as per pre-operative assessment

## 2019-07-15 NOTE — Progress Notes (Signed)
Procedure: Open L4 laminectomy, L3-4 microdiscectomy Procedure date: 07/15/2019 Diagnosis: Neurogenic claudication   History: Cory Howard is s/p open L4 laminectomy, L3-4 microdiscectomy for neurogenic claudication  POD0: Tolerated procedure well. Pain 4/10 - low back. No new lower extremity complains including pain, numbness, tingling.   Physical Exam: Vitals:   07/15/19 0607 07/15/19 1056  BP: (!) 143/91 119/82  Pulse: 71 63  Resp: 18 15  Temp: 97.8 F (36.6 C) (!) 96.4 F (35.8 C)  SpO2: 99% 100%   Strength:5/5 throughout lower extremities  Sensation: intact and symmetric throughout lower extremities   Data:  Recent Labs  Lab 07/09/19 1023  NA 138  K 4.4  CL 105  CO2 25  BUN 31*  CREATININE 2.06*  GLUCOSE 123*  CALCIUM 10.5*   No results for input(s): AST, ALT, ALKPHOS in the last 168 hours.  Invalid input(s): TBILI   Recent Labs  Lab 07/09/19 1023  WBC 8.0  HGB 14.4  HCT 40.2  PLT 120*   Recent Labs  Lab 07/09/19 1023  APTT 29  INR 1.0         Other tests/results: No imaging reviewed  Assessment/Plan:  Cory Howard is POD0 s/p open L4 laminectomy, L3-4 discectomy. Will continue to monitor  - mobilize - pain control - DVT prophylaxis - Brace - ordered  Marin Olp PA-C Department of Neurosurgery

## 2019-07-15 NOTE — Anesthesia Postprocedure Evaluation (Signed)
Anesthesia Post Note  Patient: Antrell Abraha Talavera  Procedure(s) Performed: OPEN L4 LAMINECTOMY, L3/4 DISCECTOMY (Bilateral )  Patient location during evaluation: PACU Anesthesia Type: General Level of consciousness: awake and alert Pain management: pain level controlled Vital Signs Assessment: post-procedure vital signs reviewed and stable Respiratory status: spontaneous breathing, nonlabored ventilation, respiratory function stable and patient connected to nasal cannula oxygen Cardiovascular status: blood pressure returned to baseline and stable Postop Assessment: no apparent nausea or vomiting Anesthetic complications: no     Last Vitals:  Vitals:   07/15/19 1205 07/15/19 1235  BP: 122/69 118/73  Pulse: 63 61  Resp:    Temp: 36.6 C 36.5 C  SpO2: 96% 93%    Last Pain:  Vitals:   07/15/19 1235  TempSrc: Oral  PainSc:                  Martha Clan

## 2019-07-15 NOTE — Anesthesia Preprocedure Evaluation (Signed)
Anesthesia Evaluation  Patient identified by MRN, date of birth, ID band Patient awake    Reviewed: Allergy & Precautions, H&P , NPO status , Patient's Chart, lab work & pertinent test results  History of Anesthesia Complications (+) AWARENESS UNDER ANESTHESIA and history of anesthetic complications  Airway Mallampati: III  TM Distance: >3 FB Neck ROM: limited    Dental no notable dental hx. (+) Teeth Intact, Dental Advidsory Given   Pulmonary neg pulmonary ROS, neg shortness of breath, former smoker,    Pulmonary exam normal breath sounds clear to auscultation       Cardiovascular Exercise Tolerance: Good hypertension, (-) angina+ CAD, + Cardiac Stents, + CABG and + Peripheral Vascular Disease  (-) Past MI and (-) DOE Normal cardiovascular exam(-) dysrhythmias (-) Valvular Problems/Murmurs Rhythm:regular Rate:Normal     Neuro/Psych negative neurological ROS  negative psych ROS   GI/Hepatic Neg liver ROS, GERD  ,  Endo/Other  negative endocrine ROS  Renal/GU Renal disease  negative genitourinary   Musculoskeletal   Abdominal   Peds  Hematology negative hematology ROS (+)   Anesthesia Other Findings Past Medical History:   Hypertension                                                 Lipids serum increased                                       Gout                                                         Peripheral vascular disease (HCC)                            Coronary artery disease                                      BPH (benign prostatic hyperplasia)                           Renal insufficiency                                          Vitamin D deficiency                                         Gout                                                         Urine protein increased  Carotid arterial disease (HCC)                              Past Surgical History:  ADENATOMOUS COLON POLYP                                       FEMORAL ARTERY - FEMORAL ARTERY BYPASS GRAFT                  CORONARY ARTERY BYPASS GRAFT                                  CORONARY ANGIOPLASTY                                          COLONOSCOPY, ESOPHAGOGASTRODUODENOSCOPY (EGD) *               COLONOSCOPY                                                  BMI    Body Mass Index   29.96 kg/m 2    Patient has cardiac clearance for this procedure.     Reproductive/Obstetrics negative OB ROS                             Anesthesia Physical  Anesthesia Plan  ASA: III  Anesthesia Plan: General   Post-op Pain Management:    Induction: Intravenous  PONV Risk Score and Plan: 2 and Ondansetron, Dexamethasone and Treatment may vary due to age or medical condition  Airway Management Planned: Oral ETT  Additional Equipment:   Intra-op Plan:   Post-operative Plan: Extubation in OR  Informed Consent: I have reviewed the patients History and Physical, chart, labs and discussed the procedure including the risks, benefits and alternatives for the proposed anesthesia with the patient or authorized representative who has indicated his/her understanding and acceptance.     Dental Advisory Given  Plan Discussed with: Anesthesiologist, CRNA and Surgeon  Anesthesia Plan Comments:         Anesthesia Quick Evaluation

## 2019-07-15 NOTE — Op Note (Signed)
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 presented to the clinic on4/13with ongoing bilateral leg pain.MRIrevealed stenosis at L3/4with a large disc herniation just rostral to this causing left greater than right stenosis.He had been through prescription pain medication and steroids without improvement.Lumbardecompression with discectomy and central decompression was discussed to relieve symptoms.Therisks of surgery were explained to include hematoma, infection, damage to nerve roots, CSF leak, weakness, numbness, pain, need for future surgery including fusion, heart attack, and stroke. He elected to proceed with surgery for symptom relief.   Procedure The patient was brought to the OR after informed consent was obtained.He was given general anesthesia and intubated by the anesthesia service. Vascular access lines were placed.The patient was then placed prone on a Wilson frameensuring all pressure points were padded. A time-out was performed per protocol.   The patient was sterilely prepped and draped.Amidlineincision wasfound using fluoroscopy andmarked andinstilled withlocal anesthetic with epinephrine. The skin was opened sharply and the dissection taken to the fascia. This was incised and cautery was used to dissect the subperiosteal plane to  expose the spinous processes and lamina of L2-L4. Retractors were inserted and hemostasis was achieved. X-ray was used to confirm location.  Next, a matchstickdrill bit was used to remove the entireL3lamina and superiorL4lamina to expose the ligament. We removed the ligament above and below until normal dura seen. This was seen to be very adherent on the left with a small synovial cyst. The medial facet was removed on the left and dissection continued laterally to perform a central decompression. This took some time to alternate between dissection with curettes and rongeur removal. The last portion of the cyst wall could not be removed safely from the dura and it was trimmed. The dura was seen to be expanding.   Next, the epidural space on the left above the disc space was palpated and a soft disc herniation was seen. The dura was retracted medially and the disc bulge cauterized. This was entered sharply and soft disc material expressed. This was removed with blunt probe and curettes. Once the disc was decompressed, the space was irrigated and additional large fragments removed. Once the irrigation gave no new disc material, the epidural space was inspected with a blunt probe and found to be free.   The blunt tool waspassedout thelateral recesses bilaterallyand found to be free as well.The bone edges were smooth and no obvious compression remainedcentrally.No CSF was seen.  Once the dura appeared free from all the bone edges, hemostasis was obtained with Floseal and cautery.The wound was irrigated profusely. Thefascia and musclewas then closed using 0 vicryl.Next, multiple subcutaneous and dermal layers were closed with 2-0 vicryl until the epidermis was well approximated. The skin was closed withDermabond  The patient was returned to supine position and extubated by the anesthesia service. The patient was then taken to the PACU for post-operative care whereshe was moving  extremities symmetrically.   ESTIMATED BLOOD LOSS: 100cc  SPECIMENS None  IMPLANT None   I performed the case in its entiretywith the assistance of  Lonell Face, [place],  [name], [place]

## 2019-07-15 NOTE — Interval H&P Note (Signed)
After review, we will proceed with L3/4 laminectomy for decompression of L3/4 only History and Physical Interval Note:  07/15/2019 6:43 AM  Cory Howard  has presented today for surgery, with the diagnosis of lumbar stenosis.  The various methods of treatment have been discussed with the patient and family. After consideration of risks, benefits and other options for treatment, the patient has consented to  Procedure(s): OPEN L4 LAMINECTOMY, L3/4 DISCECTOMY (Bilateral) as a surgical intervention.  The patient's history has been reviewed, patient examined, no change in status, stable for surgery.  I have reviewed the patient's chart and labs.  Questions were answered to the patient's satisfaction.     [name]

## 2019-07-15 NOTE — H&P (Signed)
Cory Howard is an 79 y.o. male.   Chief Complaint: Leg pain HPI: Cory Howard is here for evaluation of acute left anterior thigh pain that he says started a few weeks ago prompted him to go to the emergency department. He was placed on a steroid taper and he does feel like this may have helped some. The pain is not as severe but it is still present. He does not note any numbness in the distribution of the pain. He denies any right acute pain. He does also comment on some chronic, cramping type pain that will go down the back of both buttocks is into the legs and calves. He states that this is worsened with standing up straight and walking and improved by leaning forward. He has been dealing with this for some time and also has become very bothersome which limits how much walking he can do. He is here after having a MRI showed stenosis at L3/4 and L4/5. We discussed laminectomy for decompression and he is ready to proceed.   Past Medical History:  Diagnosis Date  . AAA (abdominal aortic aneurysm) (HCC)    3 cm  . Anemia   . BPH (benign prostatic hyperplasia)   . Carotid arterial disease ([place])   . Cholelithiasis   . Complication of anesthesia    was semi awake during surgery  . Coronary artery disease   . GERD (gastroesophageal reflux disease)   . Gout   . Gout   . Hypertension   . Lipids serum increased   . Lumbar herniated disc 06/28/2019  . Lumbar radiculopathy, acute 06/28/2019  . Peripheral vascular disease ([place])   . Pulmonary nodule 06/2019   seen on mri, just observing for now  . Renal insufficiency    CKD stage 3b  . Urine protein increased   . Vitamin D deficiency     Past Surgical History:  Procedure Laterality Date  . ADENATOMOUS COLON POLYP    . CARDIAC CATHETERIZATION Left 02/23/2016   Procedure: Left Heart Cath and Coronary Angiography;  Surgeon: Teodoro Spray, MD;  Location: [place] [facility];  Service: Cardiovascular;  Laterality: Left;  . CARDIAC  CATHETERIZATION Right 02/23/2016   Procedure: Bypass Graft Angiography;  Surgeon: Teodoro Spray, MD;  Location: [place] [facility];  Service: Cardiovascular;  Laterality: Right;  . CAROTID ENDARTERECTOMY Left   . COLONOSCOPY    . COLONOSCOPY WITH PROPOFOL N/A 03/20/2015   Procedure: COLONOSCOPY WITH PROPOFOL;  Surgeon: Manya Silvas, MD;  Location: [facility];  Service: Endoscopy;  Laterality: N/A;  . COLONOSCOPY, ESOPHAGOGASTRODUODENOSCOPY (EGD) AND ESOPHAGEAL DILATION    . CORONARY ANGIOPLASTY    . CORONARY ARTERY BYPASS GRAFT  2012  . EYE SURGERY Left    cataract extraction  . FEMORAL ARTERY - FEMORAL ARTERY BYPASS GRAFT      Family History  Problem Relation Age of Onset  . Cancer Mother   . Heart disease Mother   . Kidney cancer Neg Hx   . Kidney disease Neg Hx   . Prostate cancer Neg Hx    Social History:  reports that he quit smoking about 37 years ago. He has a 41.00 pack-year smoking history. He has never used smokeless tobacco. He reports that he does not drink alcohol or use drugs.  Allergies:  Allergies  Allergen Reactions  . Morphine And Related Other (See Comments)    psychosis    Medications Prior to Admission  Medication Sig Dispense Refill  . allopurinol (  ZYLOPRIM) 300 MG tablet Take 300 mg by mouth daily.    Marland Kitchen amLODipine (NORVASC) 10 MG tablet Take 10 mg by mouth daily.    Marland Kitchen aspirin EC 81 MG tablet Take 81 mg by mouth daily.    . Cholecalciferol (D3-1000 PO) Take 1 tablet by mouth daily.    Marland Kitchen HYDROcodone-acetaminophen (NORCO/VICODIN) 5-325 MG tablet Take 1 tablet by mouth every 6 (six) hours as needed for moderate pain. 20 tablet 0  . lisinopril (PRINIVIL,ZESTRIL) 2.5 MG tablet Take 2.5 mg by mouth in the morning and at bedtime.   4  . metoprolol tartrate (LOPRESSOR) 25 MG tablet Take 12.5 mg by mouth 2 (two) times daily.    Marland Kitchen omega-3 acid ethyl esters (LOVAZA) 1 g capsule Take 1 g by mouth daily.     Marland Kitchen omeprazole (PRILOSEC) 20 MG capsule Take 20 mg  by mouth daily.    . simvastatin (ZOCOR) 40 MG tablet Take 40 mg by mouth daily.    . tamsulosin (FLOMAX) 0.4 MG CAPS capsule Take 0.4 mg by mouth daily.     . predniSONE (STERAPRED UNI-PAK 21 TAB) 10 MG (21) TBPK tablet Take by mouth daily. (Patient not taking: Reported on 07/03/2019) 21 tablet 0    No results found for this or any previous visit (from the past 48 hour(s)). No results found.  Review of Systems General ROS: Negative Psychological ROS: Negative Ophthalmic ROS: Negative ENT ROS: Negative Hematological and Lymphatic ROS: Negative  Endocrine ROS: Negative Respiratory ROS: Negative Cardiovascular ROS: Negative Gastrointestinal ROS: Negative Genito-Urinary ROS: Negative Musculoskeletal ROS: Positive for back pain Neurological ROS: Positive for leg pain, spasms Dermatological ROS: Negative  Blood pressure (!) 143/91, pulse 71, temperature 97.8 F (36.6 C), temperature source Oral, resp. rate 18, height 5\' 9"  (1.753 m), weight 85 kg, SpO2 99 %. Physical Exam  General appearance: Alert, cooperative, in no acute distress Head: Normocephalic, atraumatic Eyes: Normal, EOM intact Oropharynx: Wearing facemask Back: No tenderness to palpation of the midline or paramedian regions Ext: No edema in LE bilaterally, warm extremities  Neurologic exam:  Mental status: alertness: alert, affect: normal Speech: fluent and clear Motor: 5/5 strength on the right leg in all motor groups. In the left he is 4+ out of 5 in hip flexion and knee extension. He has 5-5 in knee flexion, dorsiflexion, plantarflexion. Sensory: intact to light touch in bilateral lower extremities Gait: normal   Assessment/Plan We will proceed with L3/4 Laminectomy for decompression of L3/4 and L4/5  [name], MD 07/15/2019, 6:39 AM

## 2019-07-15 NOTE — Interval H&P Note (Signed)
History and Physical Interval Note:  Exam CV: Regular rate  Pulm: Clear to auscultation  07/15/2019 6:41 AM  Cory Howard  has presented today for surgery, with the diagnosis of lumbar stenosis.  The various methods of treatment have been discussed with the patient and family. After consideration of risks, benefits and other options for treatment, the patient has consented to  Procedure(s): OPEN L4 LAMINECTOMY, L3/4 DISCECTOMY (Bilateral) as a surgical intervention.  The patient's history has been reviewed, patient examined, no change in status, stable for surgery.  I have reviewed the patient's chart and labs.  Questions were answered to the patient's satisfaction.     [name]

## 2019-07-16 DIAGNOSIS — M48062 Spinal stenosis, lumbar region with neurogenic claudication: Secondary | ICD-10-CM | POA: Diagnosis not present

## 2019-07-16 MED ORDER — TIZANIDINE HCL 2 MG PO TABS: 2.0000 mg | ORAL_TABLET | Freq: Four times a day (QID) | ORAL | 0 refills | Status: AC | PRN

## 2019-07-16 MED ORDER — OXYCODONE HCL 5 MG PO TABS: 5.0000 mg | ORAL_TABLET | ORAL | 0 refills | Status: AC | PRN

## 2019-07-16 MED ORDER — ACETAMINOPHEN 500 MG PO TABS: 1000.0000 mg | ORAL_TABLET | Freq: Four times a day (QID) | ORAL | 0 refills | Status: DC

## 2019-07-16 MED ORDER — OXYCODONE HCL 5 MG PO TABS: 5.0000 mg | ORAL_TABLET | ORAL | 0 refills | Status: DC | PRN

## 2019-07-16 MED ORDER — METHOCARBAMOL 500 MG PO TABS: 500.0000 mg | ORAL_TABLET | Freq: Three times a day (TID) | ORAL | 0 refills | Status: DC | PRN

## 2019-07-16 MED ORDER — ACETAMINOPHEN 500 MG PO TABS: 1000.0000 mg | ORAL_TABLET | Freq: Four times a day (QID) | ORAL | 0 refills | Status: AC

## 2019-07-16 NOTE — Discharge Instructions (Signed)
Your surgeon has performed an operation on your lumbar spine (low back) to relieve pressure on one or more nerves. Many times, patients feel better immediately after surgery and can "overdo it." Even if you feel well, it is important that you follow these activity guidelines. If you do not let your back heal properly from the surgery, you can increase the chance of a disc herniation and/or return of your symptoms. The following are instructions to help in your recovery once you have been discharged from the hospital.  * Do not take anti-inflammatory medications for 3 days after surgery (naproxen [Aleve], ibuprofen [Advil, Motrin], celecoxib [Celebrex], etc.) * You may resume aspirin after 7 days  Activity    No bending, lifting, or twisting ("BLT"). Avoid lifting objects heavier than 10 pounds (gallon milk jug).  Where possible, avoid household activities that involve lifting, bending, pushing, or pulling such as laundry, vacuuming, grocery shopping, and childcare. Try to arrange for help from friends and family for these activities while your back heals.  Increase physical activity slowly as tolerated.  Taking short walks is encouraged, but avoid strenuous exercise. Do not jog, run, bicycle, lift weights, or participate in any other exercises unless specifically allowed by your doctor. Avoid prolonged sitting, including car rides.  Talk to your doctor before resuming sexual activity.  You should not drive until cleared by your doctor.  Until released by your doctor, you should not return to work or school.  You should rest at home and let your body heal.   You may shower two days after your surgery.  After showering, lightly dab your incision dry. Do not take a tub bath or go swimming for 3 weeks, or until approved by your doctor at your follow-up appointment.  If you smoke, we strongly recommend that you quit.  Smoking has been proven to interfere with normal healing in your back and will  dramatically reduce the success rate of your surgery. Please contact [facility] (800-QUIT-NOW) and use the resources at www.[facility].com for assistance in stopping smoking.  Surgical Incision   If you have a dressing on your incision, you may remove it three days after your surgery. Keep your incision area clean and dry.  If you have staples or stitches on your incision, you should have a follow up scheduled for removal. If you do not have staples or stitches, you will have steri-strips (small pieces of surgical tape) or Dermabond glue. The steri-strips/glue should begin to peel away within about a week (it is fine if the steri-strips fall off before then). If the strips are still in place one week after your surgery, you may gently remove them.  Diet            You may return to your usual diet. Be sure to stay hydrated.  When to Contact us  Although your surgery and recovery will likely be uneventful, you may have some residual numbness, aches, and pains in your back and/or legs. This is normal and should improve in the next few weeks.  However, should you experience any of the following, contact us immediately: . New numbness or weakness . Pain that is progressively getting worse, and is not relieved by your pain medications or rest . Bleeding, redness, swelling, pain, or drainage from surgical incision . Chills or flu-like symptoms . Fever greater than 101.0 F (38.3 C) . Problems with bowel or bladder functions . Difficulty breathing or shortness of breath . Warmth, tenderness, or swelling in your calf  Contact Information . During office hours (Monday-Friday 9 am to 5 pm), please call your physician at [phone] . After hours and weekends, please call [phone] and an answering service will put you in touch with either Dr. Lacinda Axon or Dr. Izora Ribas.  . For a life-threatening emergency, call 911

## 2019-07-16 NOTE — Progress Notes (Signed)
Pt complaints of inability to sleep and request a sleep aid. Dr Mitchell Heir notified and new orders placed see MAR.

## 2019-07-16 NOTE — Progress Notes (Signed)
Orthopedic Tech Progress Note Patient Details:  Florent Berhow [facility] 10-13-1940 [id number] Called in a STAT order to [employer] for a LSO brace. Patient ID: GATLYN LATRAY, male   DOB: 1940-12-19, 79 y.o.   MRN: [id number]   Janit Pagan 07/16/2019, 9:00 AM

## 2019-07-16 NOTE — Progress Notes (Signed)
Writer went over discharge paperwork with patient and he verbalized understanding. Patient was given discharge paperwork and prescription. Patient's IV removed. Patient stable. Patient has all belongings packed with him and lumbar brace. Patient transported via [facility] to private car with spouse.

## 2019-07-16 NOTE — Discharge Summary (Signed)
Procedure: Open L4 laminectomy, L3-4 microdiscectomy Procedure date: 07/15/2019 Diagnosis: Neurogenic claudication   History: Cory Howard is s/p open L4 laminectomy, L3-4 microdiscectomy for neurogenic claudication  POD1: Recovering well. Voiding, eating, and ambulating without issue. Pain 1/10.    POD0: Tolerated procedure well. Pain 4/10 - low back. No new lower extremity complains including pain, numbness, tingling.   Physical Exam: Vitals:   07/15/19 2341 07/16/19 0413  BP: 139/81 (!) 148/80  Pulse: 74 63  Resp: 16 16  Temp: 98.4 F (36.9 C) 98.3 F (36.8 C)  SpO2: 92% 95%   Strength:5/5 throughout lower extremities  Sensation: intact and symmetric throughout lower extremities   Data:  Recent Labs  Lab 07/09/19 1023  NA 138  K 4.4  CL 105  CO2 25  BUN 31*  CREATININE 2.06*  GLUCOSE 123*  CALCIUM 10.5*   No results for input(s): AST, ALT, ALKPHOS in the last 168 hours.  Invalid input(s): TBILI   Recent Labs  Lab 07/09/19 1023  WBC 8.0  HGB 14.4  HCT 40.2  PLT 120*   Recent Labs  Lab 07/09/19 1023  APTT 29  INR 1.0         Other tests/results: No imaging reviewed  Assessment/Plan:  Cory Howard is POD1 s/p open L4 laminectomy, L3-4 discectomy. Patient is recovering well and ready for discharge when brace arrives. He has ambulated, voided, and eaten without issue. Pain adequately controlled on current pain regimen. Will continue post op pain management with tylenol, muscle relaxer, and pain medication as needed.  He is scheduled to follow up in clinic in approximately 2 weeks. Advised to contact office if any questions or concerns arise before then.   Marin Olp PA-C Department of Neurosurgery

## 2019-07-23 ENCOUNTER — Encounter: Payer: Self-pay | Admitting: *Deleted

## 2020-10-16 ENCOUNTER — Ambulatory Visit (INDEPENDENT_AMBULATORY_CARE_PROVIDER_SITE_OTHER): Payer: Medicare Other

## 2020-10-16 ENCOUNTER — Other Ambulatory Visit: Payer: Self-pay

## 2020-10-16 ENCOUNTER — Ambulatory Visit (INDEPENDENT_AMBULATORY_CARE_PROVIDER_SITE_OTHER): Payer: Medicare Other | Admitting: Vascular Surgery

## 2020-10-16 VITALS — BP 124/70 | HR 61 | Ht 69.0 in | Wt 185.0 lb

## 2020-10-16 DIAGNOSIS — E785 Hyperlipidemia, unspecified: Secondary | ICD-10-CM

## 2020-10-16 DIAGNOSIS — I70211 Atherosclerosis of native arteries of extremities with intermittent claudication, right leg: Secondary | ICD-10-CM

## 2020-10-16 DIAGNOSIS — I10 Essential (primary) hypertension: Secondary | ICD-10-CM

## 2020-10-16 DIAGNOSIS — I6523 Occlusion and stenosis of bilateral carotid arteries: Secondary | ICD-10-CM | POA: Diagnosis not present

## 2020-10-16 NOTE — Assessment & Plan Note (Signed)
lipid control important in reducing the progression of atherosclerotic disease. Continue statin therapy  

## 2020-10-16 NOTE — Assessment & Plan Note (Signed)
Duplex today shows a patent left carotid endarterectomy and 1 to 39% right ICA stenosis which is stable.  No role for intervention.  Continue medications including Zocor.  Recheck in 2 years.

## 2020-10-16 NOTE — Progress Notes (Signed)
MRN : [id number]  Cory Howard is a 80 y.o. (1941/03/15) male who presents with chief complaint of  Chief Complaint  Patient presents with   Follow-up    2 yr ABI Carotid BLE Arterial   .  History of Present Illness: Patient returns today in follow up of multiple vascular issues.  The patient reports he is overall doing well.  He has had back surgery since his last visit.  This is helped his back pain but not completely removed.  He has a long history of both peripheral arterial disease and carotid artery stenosis.  He is status post left carotid endarterectomy many years ago.  He is also status post femoral to femoral bypass about 10 years ago.  He does still have some pain in his legs with walking but it is difficult to discern if this is related to circulation or his back.  No open wounds or infection.  ABIs today are normal at 1.11 on the right and 1.16 on the left with multiphasic waveforms.  Duplex shows no significant stenosis within the femoral to femoral bypass or either lower extremity. Duplex today shows a patent left carotid endarterectomy and 1 to 39% right ICA stenosis which is stable.  Current Outpatient Medications  Medication Sig Dispense Refill   acetaminophen (TYLENOL) 500 MG tablet Take 2 tablets (1,000 mg total) by mouth every 6 (six) hours. 30 tablet 0   Cholecalciferol (D3-1000 PO) Take 1 tablet by mouth daily.     gabapentin (NEURONTIN) 100 MG capsule Take by mouth.     omega-3 acid ethyl esters (LOVAZA) 1 g capsule Take 1 g by mouth daily.      omeprazole (PRILOSEC) 20 MG capsule Take 20 mg by mouth daily.     simvastatin (ZOCOR) 40 MG tablet Take 40 mg by mouth daily.     tamsulosin (FLOMAX) 0.4 MG CAPS capsule Take 0.4 mg by mouth daily.      No current facility-administered medications for this visit.   Facility-Administered Medications Ordered in Other Visits  Medication Dose Route Frequency Provider Last Rate Last Admin   0.9 %  sodium chloride infusion   250 mL Intravenous PRN Teodoro Spray, MD       sodium chloride flush (NS) 0.9 % injection 3 mL  3 mL Intravenous Q12H Teodoro Spray, MD       sodium chloride flush (NS) 0.9 % injection 3 mL  3 mL Intravenous PRN Teodoro Spray, MD        Past Medical History:  Diagnosis Date   AAA (abdominal aortic aneurysm) (HCC)    3 cm   Anemia    BPH (benign prostatic hyperplasia)    Carotid arterial disease (HCC)    Cholelithiasis    Complication of anesthesia    was semi awake during surgery   Coronary artery disease    GERD (gastroesophageal reflux disease)    Gout    Gout    Hypertension    Lipids serum increased    Lumbar herniated disc 06/28/2019   Lumbar radiculopathy, acute 06/28/2019   Peripheral vascular disease ([place])    Pulmonary nodule 06/2019   seen on mri, just observing for now   Renal insufficiency    CKD stage 3b   Urine protein increased    Vitamin D deficiency     Past Surgical History:  Procedure Laterality Date   ADENATOMOUS COLON POLYP     CARDIAC CATHETERIZATION Left 02/23/2016   Procedure:  Left Heart Cath and Coronary Angiography;  Surgeon: Teodoro Spray, MD;  Location: [facility];  Service: Cardiovascular;  Laterality: Left;   CARDIAC CATHETERIZATION Right 02/23/2016   Procedure: Bypass Graft Angiography;  Surgeon: Teodoro Spray, MD;  Location: [facility];  Service: Cardiovascular;  Laterality: Right;   CAROTID ENDARTERECTOMY Left    COLONOSCOPY     COLONOSCOPY WITH PROPOFOL N/A 03/20/2015   Procedure: COLONOSCOPY WITH PROPOFOL;  Surgeon: Manya Silvas, MD;  Location: [facility];  Service: Endoscopy;  Laterality: N/A;   COLONOSCOPY, ESOPHAGOGASTRODUODENOSCOPY (EGD) AND ESOPHAGEAL DILATION     CORONARY ANGIOPLASTY     CORONARY ARTERY BYPASS GRAFT  2012   EYE SURGERY Left    cataract extraction   FEMORAL ARTERY - FEMORAL ARTERY BYPASS GRAFT     LUMBAR LAMINECTOMY/DECOMPRESSION MICRODISCECTOMY Bilateral 07/15/2019   Procedure:  OPEN L4 LAMINECTOMY, L3/4 DISCECTOMY;  Surgeon: [name], MD;  Location: [facility];  Service: Neurosurgery;  Laterality: Bilateral;     Social History   Tobacco Use   Smoking status: Former    Packs/day: 1.00    Years: 41.00    Pack years: 41.00    Types: Cigarettes    Quit date: 02/22/1982    Years since quitting: 38.6   Smokeless tobacco: Never  Vaping Use   Vaping Use: Never used  Substance Use Topics   Alcohol use: No   Drug use: No      Family History  Problem Relation Age of Onset   Cancer Mother    Heart disease Mother    Kidney cancer Neg Hx    Kidney disease Neg Hx    Prostate cancer Neg Hx      Allergies  Allergen Reactions   Morphine And Related Other (See Comments)    psychosis    REVIEW OF SYSTEMS (Negative unless checked)   Constitutional: '[]'$ Weight loss  '[]'$ Fever  '[]'$ Chills Cardiac: '[]'$ Chest pain   '[]'$ Chest pressure   '[]'$ Palpitations   '[]'$ Shortness of breath when laying flat   '[]'$ Shortness of breath at rest   '[]'$ Shortness of breath with exertion. Vascular:  '[x]'$ Pain in legs with walking   '[]'$ Pain in legs at rest   '[]'$ Pain in legs when laying flat   '[x]'$ Claudication   '[]'$ Pain in feet when walking  '[]'$ Pain in feet at rest  '[]'$ Pain in feet when laying flat   '[]'$ History of DVT   '[]'$ Phlebitis   '[]'$ Swelling in legs   '[]'$ Varicose veins   '[]'$ Non-healing ulcers Pulmonary:   '[]'$ Uses home oxygen   '[]'$ Productive cough   '[]'$ Hemoptysis   '[]'$ Wheeze  '[]'$ COPD   '[]'$ Asthma Neurologic:  '[]'$ Dizziness  '[]'$ Blackouts   '[]'$ Seizures   '[]'$ History of stroke   '[]'$ History of TIA  '[]'$ Aphasia   '[]'$ Temporary blindness   '[]'$ Dysphagia   '[]'$ Weakness or numbness in arms   '[]'$ Weakness or numbness in legs Musculoskeletal:  '[x]'$ Arthritis   '[]'$ Joint swelling   '[]'$ Joint pain   '[x]'$ Low back pain Hematologic:  '[]'$ Easy bruising  '[]'$ Easy bleeding   '[]'$ Hypercoagulable state   '[]'$ Anemic  '[]'$ Hepatitis Gastrointestinal:  '[]'$ Blood in stool   '[]'$ Vomiting blood  '[]'$ Gastroesophageal reflux/heartburn   '[]'$ Difficulty swallowing. Genitourinary:  '[]'$ Chronic  kidney disease   '[]'$ Difficult urination  '[]'$ Frequent urination  '[]'$ Burning with urination   '[]'$ Blood in urine Skin:  '[]'$ Rashes   '[]'$ Ulcers   '[]'$ Wounds Psychological:  '[]'$ History of anxiety   '[]'$  History of major depression.  Physical Examination  BP 124/70   Pulse 61   Ht '5\' 9"'$  (1.753 m)  Wt 185 lb (83.9 kg)   BMI 27.32 kg/m  Gen:  WD/WN, NAD.  Appears younger than stated age Head: [place]/AT, No temporalis wasting. Ear/Nose/Throat: Hearing grossly intact, nares w/o erythema or drainage Eyes: Conjunctiva clear. Sclera non-icteric Neck: Supple.  Trachea midline.  No bruit Pulmonary:  Good air movement, no use of accessory muscles.  Cardiac: RRR, no JVD Vascular:  Vessel Right Left  Radial Palpable Palpable                          PT Palpable Palpable  DP Palpable Palpable   Gastrointestinal: soft, non-tender/non-distended. No guarding/reflex.  Musculoskeletal: M/S 5/5 throughout.  No deformity or atrophy.  No significant lower extremity edema. Neurologic: Sensation grossly intact in extremities.  Symmetrical.  Speech is fluent.  Psychiatric: Judgment intact, Mood & affect appropriate for pt's clinical situation. Dermatologic: No rashes or ulcers noted.  No cellulitis or open wounds.      Labs No results found for this or any previous visit (from the past 2160 hour(s)).  Radiology No results found.  Assessment/Plan Essential hypertension, benign blood pressure control important in reducing the progression of atherosclerotic disease. On appropriate oral medications.  Atherosclerosis of native arteries of extremity with intermittent claudication (HCC) ABIs today are normal at 1.11 on the right and 1.16 on the left with multiphasic waveforms.  Duplex shows no significant stenosis within the femoral to femoral bypass or either lower extremity.  Doing well status post left right femoral to femoral bypass about a decade ago.  This far out, we can probably go to checking every other  year.  Carotid stenosis Duplex today shows a patent left carotid endarterectomy and 1 to 39% right ICA stenosis which is stable.  No role for intervention.  Continue medications including Zocor.  Recheck in 2 years.  Hyperlipidemia lipid control important in reducing the progression of atherosclerotic disease. Continue statin therapy    Leotis Pain, MD  10/16/2020 9:32 AM    This note was created with Dragon medical transcription system.  Any errors from dictation are purely unintentional

## 2020-10-16 NOTE — Assessment & Plan Note (Signed)
ABIs today are normal at 1.11 on the right and 1.16 on the left with multiphasic waveforms.  Duplex shows no significant stenosis within the femoral to femoral bypass or either lower extremity.  Doing well status post left right femoral to femoral bypass about a decade ago.  This far out, we can probably go to checking every other year.

## 2021-01-22 ENCOUNTER — Encounter: Payer: Self-pay | Admitting: *Deleted

## 2021-01-25 ENCOUNTER — Ambulatory Visit: Payer: Medicare Other | Admitting: Anesthesiology

## 2021-01-25 ENCOUNTER — Encounter
Admission: RE | Disposition: A | Discharge status: Home / Self Care | Payer: Self-pay | Source: Ambulatory Visit | Attending: Gastroenterology

## 2021-01-25 ENCOUNTER — Other Ambulatory Visit: Payer: Self-pay

## 2021-01-25 ENCOUNTER — Encounter: Payer: Self-pay | Admitting: *Deleted

## 2021-01-25 ENCOUNTER — Ambulatory Visit
Admission: RE | Admit: 2021-01-25 | Discharge: 2021-01-25 | Disposition: A | Discharge status: Home / Self Care | Payer: Medicare Other | Source: Ambulatory Visit | Attending: Gastroenterology | Admitting: Gastroenterology

## 2021-01-25 DIAGNOSIS — I129 Hypertensive chronic kidney disease with stage 1 through stage 4 chronic kidney disease, or unspecified chronic kidney disease: Secondary | ICD-10-CM | POA: Diagnosis not present

## 2021-01-25 DIAGNOSIS — M109 Gout, unspecified: Secondary | ICD-10-CM | POA: Insufficient documentation

## 2021-01-25 DIAGNOSIS — I739 Peripheral vascular disease, unspecified: Secondary | ICD-10-CM | POA: Insufficient documentation

## 2021-01-25 DIAGNOSIS — I2510 Atherosclerotic heart disease of native coronary artery without angina pectoris: Secondary | ICD-10-CM | POA: Diagnosis not present

## 2021-01-25 DIAGNOSIS — Z9861 Coronary angioplasty status: Secondary | ICD-10-CM | POA: Insufficient documentation

## 2021-01-25 DIAGNOSIS — D122 Benign neoplasm of ascending colon: Secondary | ICD-10-CM | POA: Insufficient documentation

## 2021-01-25 DIAGNOSIS — D123 Benign neoplasm of transverse colon: Secondary | ICD-10-CM | POA: Insufficient documentation

## 2021-01-25 DIAGNOSIS — Z79899 Other long term (current) drug therapy: Secondary | ICD-10-CM | POA: Diagnosis not present

## 2021-01-25 DIAGNOSIS — K635 Polyp of colon: Secondary | ICD-10-CM | POA: Diagnosis not present

## 2021-01-25 DIAGNOSIS — Z951 Presence of aortocoronary bypass graft: Secondary | ICD-10-CM | POA: Diagnosis not present

## 2021-01-25 DIAGNOSIS — Z1211 Encounter for screening for malignant neoplasm of colon: Secondary | ICD-10-CM | POA: Insufficient documentation

## 2021-01-25 DIAGNOSIS — E785 Hyperlipidemia, unspecified: Secondary | ICD-10-CM | POA: Diagnosis not present

## 2021-01-25 DIAGNOSIS — E559 Vitamin D deficiency, unspecified: Secondary | ICD-10-CM | POA: Insufficient documentation

## 2021-01-25 DIAGNOSIS — I252 Old myocardial infarction: Secondary | ICD-10-CM | POA: Diagnosis not present

## 2021-01-25 DIAGNOSIS — N400 Enlarged prostate without lower urinary tract symptoms: Secondary | ICD-10-CM | POA: Diagnosis not present

## 2021-01-25 DIAGNOSIS — N1832 Chronic kidney disease, stage 3b: Secondary | ICD-10-CM | POA: Insufficient documentation

## 2021-01-25 DIAGNOSIS — Z8371 Family history of colonic polyps: Secondary | ICD-10-CM | POA: Insufficient documentation

## 2021-01-25 DIAGNOSIS — K640 First degree hemorrhoids: Secondary | ICD-10-CM | POA: Diagnosis not present

## 2021-01-25 DIAGNOSIS — K219 Gastro-esophageal reflux disease without esophagitis: Secondary | ICD-10-CM | POA: Insufficient documentation

## 2021-01-25 DIAGNOSIS — D120 Benign neoplasm of cecum: Secondary | ICD-10-CM | POA: Insufficient documentation

## 2021-01-25 DIAGNOSIS — I714 Abdominal aortic aneurysm, without rupture, unspecified: Secondary | ICD-10-CM | POA: Diagnosis not present

## 2021-01-25 HISTORY — DX: Plantar fascial fibromatosis: M72.2

## 2021-01-25 HISTORY — DX: Personal history of other infectious and parasitic diseases: Z86.19

## 2021-01-25 HISTORY — DX: Hyperlipidemia, unspecified: E78.5

## 2021-01-25 HISTORY — DX: Acute myocardial infarction, unspecified: I21.9

## 2021-01-25 HISTORY — PX: COLONOSCOPY WITH PROPOFOL: SHX5780

## 2021-01-25 HISTORY — DX: Hallux rigidus, unspecified foot: M20.20

## 2021-01-25 SURGERY — COLONOSCOPY WITH PROPOFOL
Anesthesia: General

## 2021-01-25 MED ORDER — PROPOFOL 500 MG/50ML IV EMUL
INTRAVENOUS | Status: DC | PRN
  Administered 2021-01-25: 140 ug/kg/min via INTRAVENOUS

## 2021-01-25 MED ORDER — PROPOFOL 10 MG/ML IV BOLUS
INTRAVENOUS | Status: DC | PRN
  Administered 2021-01-25: 70 mg via INTRAVENOUS

## 2021-01-25 MED ORDER — SODIUM CHLORIDE 0.9 % IV SOLN: INTRAVENOUS | Status: DC

## 2021-01-25 NOTE — H&P (Signed)
[facility] Pre-Procedure H&P   Patient ID: Cory Howard is a 80 y.o. male.  Gastroenterology Provider: Annamaria Helling, DO  Referring Provider: Laurine Blazer, PA PCP: Baxter Hire, MD  Date: 01/25/2021  HPI Mr. VIRAAT VANPATTEN is a 80 y.o. male who presents today for Colonoscopy for surveillance for personal and family history of polyps.  Patient has no GI complaints. No melena, hematochezia, diarrhea or constipation. Weight and appetite stable. Hgb 14.5.  Colonoscopy in 2016- 3 TAs  Brother with h/o polyps requiring colonic resection. No crc  Past Medical History:  Diagnosis Date   AAA (abdominal aortic aneurysm)    3 cm   Anemia    BPH (benign prostatic hyperplasia)    Carotid arterial disease (HCC)    Cholelithiasis    Complication of anesthesia    was semi awake during surgery   Coronary artery disease    GERD (gastroesophageal reflux disease)    Gout    Gout    Hallux rigidus    History of shingles    HLD (hyperlipidemia)    Hypertension    Lipids serum increased    Lumbar herniated disc 06/28/2019   Lumbar radiculopathy, acute 06/28/2019   Myocardial infarction [facility])    Peripheral vascular disease ([place])    Plantar fascial fibromatosis    Pulmonary nodule 06/2019   seen on mri, just observing for now   Renal insufficiency    CKD stage 3b   Urine protein increased    Vitamin D deficiency     Past Surgical History:  Procedure Laterality Date   ADENATOMOUS COLON POLYP     CARDIAC CATHETERIZATION Left 02/23/2016   Procedure: Left Heart Cath and Coronary Angiography;  Surgeon: Teodoro Spray, MD;  Location: [facility];  Service: Cardiovascular;  Laterality: Left;   CARDIAC CATHETERIZATION Right 02/23/2016   Procedure: Bypass Graft Angiography;  Surgeon: Teodoro Spray, MD;  Location: [facility];  Service: Cardiovascular;  Laterality: Right;   CAROTID ENDARTERECTOMY Left    COLONOSCOPY     COLONOSCOPY WITH  PROPOFOL N/A 03/20/2015   Procedure: COLONOSCOPY WITH PROPOFOL;  Surgeon: Manya Silvas, MD;  Location[facility];  Service: Endoscopy;  Laterality: N/A;   COLONOSCOPY, ESOPHAGOGASTRODUODENOSCOPY (EGD) AND ESOPHAGEAL DILATION     CORONARY ANGIOPLASTY     CORONARY ARTERY BYPASS GRAFT  2012   EYE SURGERY Left    cataract extraction   FEMORAL ARTERY - FEMORAL ARTERY BYPASS GRAFT     LUMBAR LAMINECTOMY/DECOMPRESSION MICRODISCECTOMY Bilateral 07/15/2019   Procedure: OPEN L4 LAMINECTOMY, L3/4 DISCECTOMY;  Surgeon: [name], MD;  Location: [facility];  Service: Neurosurgery;  Laterality: Bilateral;    Family History Brother- colon polyps No h/o GI disease or malignancy  Review of Systems  Constitutional:  Negative for activity change, appetite change, chills, fatigue, fever and unexpected weight change.  HENT:  Negative for trouble swallowing and voice change.   Respiratory:  Negative for shortness of breath and wheezing.   Cardiovascular:  Negative for chest pain, palpitations and leg swelling.  Gastrointestinal:  Negative for abdominal distention, abdominal pain, anal bleeding, blood in stool, constipation, diarrhea, nausea and vomiting.  Musculoskeletal:  Negative for arthralgias and myalgias.  Skin:  Negative for color change and pallor.  Neurological:  Negative for dizziness, syncope and weakness.  Psychiatric/Behavioral:  Negative for confusion. The patient is not nervous/anxious.   All other systems reviewed and are negative.   Medications Current Facility-Administered Medications on File Prior  to Encounter  Medication Dose Route Frequency Provider Last Rate Last Admin   0.9 %  sodium chloride infusion  250 mL Intravenous PRN Teodoro Spray, MD       sodium chloride flush (NS) 0.9 % injection 3 mL  3 mL Intravenous Q12H Teodoro Spray, MD       sodium chloride flush (NS) 0.9 % injection 3 mL  3 mL Intravenous PRN Teodoro Spray, MD       Current Outpatient Medications  on File Prior to Encounter  Medication Sig Dispense Refill   allopurinol (ZYLOPRIM) 300 MG tablet Take 300 mg by mouth daily.     amLODipine (NORVASC) 10 MG tablet Take 10 mg by mouth daily.     aspirin EC 81 MG tablet Take 81 mg by mouth daily. Swallow whole.     Cholecalciferol (D3-1000 PO) Take 1 tablet by mouth daily.     gabapentin (NEURONTIN) 100 MG capsule Take by mouth.     Garlic-Parsley 83-4 MG CAPS Take by mouth.     lisinopril (ZESTRIL) 2.5 MG tablet Take 2.5 mg by mouth daily.     metoprolol tartrate (LOPRESSOR) 25 MG tablet Take 25 mg by mouth daily as needed.     omega-3 acid ethyl esters (LOVAZA) 1 g capsule Take 1 g by mouth daily.      omeprazole (PRILOSEC) 20 MG capsule Take 20 mg by mouth daily.     simvastatin (ZOCOR) 40 MG tablet Take 40 mg by mouth daily.     tamsulosin (FLOMAX) 0.4 MG CAPS capsule Take 0.4 mg by mouth daily.      acetaminophen (TYLENOL) 500 MG tablet Take 2 tablets (1,000 mg total) by mouth every 6 (six) hours. 30 tablet 0    Pertinent medications related to GI and procedure were reviewed by me with the patient prior to the procedure   Current Facility-Administered Medications:    0.9 %  sodium chloride infusion, , Intravenous, Continuous, Annamaria Helling, DO, Last Rate: 20 mL/hr at 01/25/21 1031, New Bag at 01/25/21 1031  Facility-Administered Medications Ordered in Other Encounters:    0.9 %  sodium chloride infusion, 250 mL, Intravenous, PRN, Teodoro Spray, MD   sodium chloride flush (NS) 0.9 % injection 3 mL, 3 mL, Intravenous, Q12H, Fath, Javier Docker, MD   sodium chloride flush (NS) 0.9 % injection 3 mL, 3 mL, Intravenous, PRN, Teodoro Spray, MD  sodium chloride 20 mL/hr at 01/25/21 1031       Allergies  Allergen Reactions   Morphine And Related Other (See Comments)    psychosis   Allergies were reviewed by me prior to the procedure  Objective    Vitals:   01/25/21 1016  BP: (!) 154/78  Pulse: (!) 59  Resp: 18  Temp:  (!) 96.5 F (35.8 C)  TempSrc: Temporal  SpO2: 98%  Weight: 86.2 kg  Height: 5\' 8"  (1.727 m)     Physical Exam Vitals and nursing note reviewed.  Constitutional:      General: He is not in acute distress.    Appearance: Normal appearance. He is not toxic-appearing or diaphoretic.  HENT:     Head: Normocephalic and atraumatic.     Nose: Nose normal.     Mouth/Throat:     Mouth: Mucous membranes are moist.     Pharynx: Oropharynx is clear.  Eyes:     General: No scleral icterus.    Extraocular Movements: Extraocular movements intact.  Cardiovascular:  Rate and Rhythm: Regular rhythm. Bradycardia present.     Heart sounds: Normal heart sounds. No murmur heard.   No friction rub. No gallop.  Pulmonary:     Effort: Pulmonary effort is normal. No respiratory distress.     Breath sounds: Normal breath sounds. No wheezing, rhonchi or rales.  Abdominal:     General: Abdomen is flat. Bowel sounds are normal. There is no distension.     Palpations: Abdomen is soft.     Tenderness: There is no abdominal tenderness. There is no guarding or rebound.  Musculoskeletal:     Cervical back: Neck supple.     Right lower leg: No edema.     Left lower leg: No edema.  Skin:    General: Skin is warm and dry.     Coloration: Skin is not jaundiced or pale.  Neurological:     General: No focal deficit present.     Mental Status: He is alert and oriented to person, place, and time. Mental status is at baseline.  Psychiatric:        Mood and Affect: Mood normal.        Behavior: Behavior normal.        Thought Content: Thought content normal.        Judgment: Judgment normal.     Assessment:  Mr. KIANTE CIAVARELLA is a 80 y.o. male  who presents today for Colonoscopy for surveillance for personal and family history of polyps.  Plan:  Colonoscopy with possible intervention today  Colonoscopy with possible biopsy, control of bleeding, polypectomy, and interventions as necessary has been  discussed with the patient/patient representative. Informed consent was obtained from the patient/patient representative after explaining the indication, nature, and risks of the procedure including but not limited to death, bleeding, perforation, missed neoplasm/lesions, cardiorespiratory compromise, and reaction to medications. Opportunity for questions was given and appropriate answers were provided. Patient/patient representative has verbalized understanding is amenable to undergoing the procedure.   Annamaria Helling, DO  [facility]  Portions of the record may have been created with voice recognition software. Occasional wrong-word or 'sound-a-like' substitutions may have occurred due to the inherent limitations of voice recognition software.  Read the chart carefully and recognize, using context, where substitutions may have occurred.

## 2021-01-25 NOTE — Op Note (Signed)
[facility] Patient Name: Cory Howard Procedure Date: 01/25/2021 10:58 AM MRN: [id number] Account #: [id number] Date of Birth: [birth year] Admit Type: Outpatient Age: 80 Room: [facility] ENDO ROOM 2 Gender: Male Note Status: Finalized Instrument Name: Colonscope [id number] Procedure:             Colonoscopy Indications:           High risk colon cancer surveillance: Personal history                         of colonic polyps Providers:             Rueben Bash, DO Referring MD:          Baxter Hire, MD (Referring MD) Medicines:             Monitored Anesthesia Care Complications:         No immediate complications. Estimated blood loss:                         Minimal. Procedure:             Pre-Anesthesia Assessment:                        - Prior to the procedure, a History and Physical was                         performed, and patient medications and allergies were                         reviewed. The patient is competent. The risks and                         benefits of the procedure and the sedation options and                         risks were discussed with the patient. All questions                         were answered and informed consent was obtained.                         Patient identification and proposed procedure were                         verified by the physician, the nurse, the anesthetist                         and the technician in the endoscopy suite. Mental                         Status Examination: alert and oriented. Airway                         Examination: normal oropharyngeal airway and neck                         mobility. Respiratory Examination: clear to  auscultation. CV Examination: RRR, no murmurs, no S3                         or S4. Prophylactic Antibiotics: The patient does not                         require prophylactic antibiotics. Prior                          Anticoagulants: The patient has taken no previous                         anticoagulant or antiplatelet agents. ASA Grade                         Assessment: III - A patient with severe systemic                         disease. After reviewing the risks and benefits, the                         patient was deemed in satisfactory condition to                         undergo the procedure. The anesthesia plan was to use                         monitored anesthesia care (MAC). Immediately prior to                         administration of medications, the patient was                         re-assessed for adequacy to receive sedatives. The                         heart rate, respiratory rate, oxygen saturations,                         blood pressure, adequacy of pulmonary ventilation, and                         response to care were monitored throughout the                         procedure. The physical status of the patient was                         re-assessed after the procedure.                        After obtaining informed consent, the colonoscope was                         passed under direct vision. Throughout the procedure,                         the patient's blood pressure, pulse, and oxygen  saturations were monitored continuously. The                         Colonoscope was introduced through the anus and                         advanced to the the cecum, identified by appendiceal                         orifice and ileocecal valve. The colonoscopy was                         performed without difficulty. The patient tolerated                         the procedure well. The quality of the bowel                         preparation was evaluated using the BBPS [facility] Bowel                         Preparation Scale) with scores of: Right Colon = 2                         (minor amount of residual staining, small fragments of                          stool and/or opaque liquid, but mucosa seen well),                         Transverse Colon = 3 (entire mucosa seen well with no                         residual staining, small fragments of stool or opaque                         liquid) and Left Colon = 3 (entire mucosa seen well                         with no residual staining, small fragments of stool or                         opaque liquid). The total BBPS score equals 8. The                         quality of the bowel preparation was excellent. The                         ileocecal valve, appendiceal orifice, and rectum were                         photographed. Findings:      The perianal and digital rectal examinations were normal. Pertinent       negatives include normal sphincter tone.      A 2 to 3 mm polyp was found in the cecum. The polyp was sessile. The       polyp was  removed with a cold biopsy forceps. Resection and retrieval       were complete. Estimated blood loss was minimal.      Two sessile polyps were found in the ascending colon. The polyps were 2       to 3 mm in size. These polyps were removed with a cold biopsy forceps.       Resection and retrieval were complete. Estimated blood loss was minimal.      Two sessile polyps were found in the transverse colon. The polyps were 2       to 3 mm in size. These polyps were removed with a cold biopsy forceps.       Resection and retrieval were complete. Estimated blood loss was minimal.      A 2 to 3 mm polyp was found in the descending colon. The polyp was       sessile. The polyp was removed with a cold biopsy forceps. Resection and       retrieval were complete. Estimated blood loss was minimal.      Non-bleeding internal hemorrhoids were found during retroflexion. The       hemorrhoids were Grade I (internal hemorrhoids that do not prolapse).      The exam was otherwise without abnormality on direct and retroflexion       views. Impression:            - One 2 to 3  mm polyp in the cecum, removed with a                         cold biopsy forceps. Resected and retrieved.                        - Two 2 to 3 mm polyps in the ascending colon, removed                         with a cold biopsy forceps. Resected and retrieved.                        - Two 2 to 3 mm polyps in the transverse colon,                         removed with a cold biopsy forceps. Resected and                         retrieved.                        - One 2 to 3 mm polyp in the descending colon, removed                         with a cold biopsy forceps. Resected and retrieved.                        - Non-bleeding internal hemorrhoids.                        - The examination was otherwise normal on direct and                         retroflexion views. Recommendation:        -  Discharge patient to home.                        - Resume previous diet.                        - Continue present medications.                        - Await pathology results.                        - Repeat colonoscopy for surveillance based on                         pathology results.                        - Return to referring physician as previously                         scheduled. Procedure Code(s):     --- Professional ---                        [id number], Colonoscopy, flexible; with biopsy, single or                         multiple Diagnosis Code(s):     --- Professional ---                        K63.5, Polyp of colon                        Z86.010, Personal history of colonic polyps                        K64.0, First degree hemorrhoids CPT copyright 2019 American Medical Association. All rights reserved. The codes documented in this report are preliminary and upon coder review may  be revised to meet current compliance requirements. Attending Participation:      I personally performed the entire procedure. Volney American, DO Annamaria Helling DO, DO 01/25/2021 11:44:57 AM This report  has been signed electronically. Number of Addenda: 0 Note Initiated On: 01/25/2021 10:58 AM Scope Withdrawal Time: 0 hours 20 minutes 44 seconds  Total Procedure Duration: 0 hours 25 minutes 20 seconds  Estimated Blood Loss:  Estimated blood loss was minimal.      [facility]

## 2021-01-25 NOTE — Anesthesia Preprocedure Evaluation (Signed)
Anesthesia Evaluation  Patient identified by MRN, date of birth, ID band Patient awake    Reviewed: Allergy & Precautions, NPO status , Patient's Chart, lab work & pertinent test results  Airway Mallampati: II  TM Distance: >3 FB Neck ROM: full    Dental  (+) Teeth Intact   Pulmonary neg pulmonary ROS, former smoker,    Pulmonary exam normal breath sounds clear to auscultation       Cardiovascular Exercise Tolerance: Good hypertension, Pt. on medications + CAD  negative cardio ROS Normal cardiovascular exam Rhythm:Regular Rate:Normal     Neuro/Psych negative neurological ROS  negative psych ROS   GI/Hepatic negative GI ROS, Neg liver ROS, GERD  ,  Endo/Other  negative endocrine ROS  Renal/GU negative Renal ROS  negative genitourinary   Musculoskeletal   Abdominal Normal abdominal exam  (+)   Peds negative pediatric ROS (+)  Hematology negative hematology ROS (+) Blood dyscrasia, anemia ,   Anesthesia Other Findings Past Medical History: No date: AAA (abdominal aortic aneurysm)     Comment:  3 cm No date: Anemia No date: BPH (benign prostatic hyperplasia) No date: Carotid arterial disease (HCC) No date: Cholelithiasis No date: Complication of anesthesia     Comment:  was semi awake during surgery No date: Coronary artery disease No date: GERD (gastroesophageal reflux disease) No date: Gout No date: Gout No date: Hallux rigidus No date: History of shingles No date: HLD (hyperlipidemia) No date: Hypertension No date: Lipids serum increased 06/28/2019: Lumbar herniated disc 06/28/2019: Lumbar radiculopathy, acute No date: Myocardial infarction [facility]) No date: Peripheral vascular disease (HCC) No date: Plantar fascial fibromatosis 06/2019: Pulmonary nodule     Comment:  seen on mri, just observing for now No date: Renal insufficiency     Comment:  CKD stage 3b No date: Urine protein increased No date:  Vitamin D deficiency  Past Surgical History: No date: ADENATOMOUS COLON POLYP 02/23/2016: CARDIAC CATHETERIZATION; Left     Comment:  Procedure: Left Heart Cath and Coronary Angiography;                Surgeon: Teodoro Spray, MD;  Location: [place] [facility]               LAB;  Service: Cardiovascular;  Laterality: Left; 02/23/2016: CARDIAC CATHETERIZATION; Right     Comment:  Procedure: Bypass Graft Angiography;  Surgeon: Teodoro Spray, MD;  Location: [place] [facility];  Service:               Cardiovascular;  Laterality: Right; No date: CAROTID ENDARTERECTOMY; Left No date: COLONOSCOPY 03/20/2015: COLONOSCOPY WITH PROPOFOL; N/A     Comment:  Procedure: COLONOSCOPY WITH PROPOFOL;  Surgeon: Manya Silvas, MD;  Location: [facility] ENDOSCOPY;  Service:               Endoscopy;  Laterality: N/A; No date: COLONOSCOPY, ESOPHAGOGASTRODUODENOSCOPY (EGD) AND ESOPHAGEAL  DILATION No date: CORONARY ANGIOPLASTY 2012: CORONARY ARTERY BYPASS GRAFT No date: EYE SURGERY; Left     Comment:  cataract extraction No date: FEMORAL ARTERY - FEMORAL ARTERY BYPASS GRAFT 07/15/2019: LUMBAR LAMINECTOMY/DECOMPRESSION MICRODISCECTOMY; Bilateral     Comment:  Procedure: OPEN L4 LAMINECTOMY, L3/4 DISCECTOMY;                Surgeon: [name], MD;  Location: [facility] ORS;  Service:  Neurosurgery;  Laterality: Bilateral;  BMI    Body Mass Index: 28.89 kg/m      Reproductive/Obstetrics negative OB ROS                             Anesthesia Physical Anesthesia Plan  ASA: 3  Anesthesia Plan: General   Post-op Pain Management:    Induction:   PONV Risk Score and Plan: Propofol infusion and TIVA  Airway Management Planned: Nasal Cannula  Additional Equipment:   Intra-op Plan:   Post-operative Plan:   Informed Consent: I have reviewed the patients History and Physical, chart, labs and discussed the procedure including the risks,  benefits and alternatives for the proposed anesthesia with the patient or authorized representative who has indicated his/her understanding and acceptance.     Dental Advisory Given  Plan Discussed with: CRNA and Surgeon  Anesthesia Plan Comments:         Anesthesia Quick Evaluation

## 2021-01-25 NOTE — Anesthesia Postprocedure Evaluation (Signed)
Anesthesia Post Note  Patient: Cory Howard  Procedure(s) Performed: COLONOSCOPY WITH PROPOFOL  Patient location during evaluation: PACU Anesthesia Type: General Level of consciousness: awake and awake and alert Pain management: satisfactory to patient Vital Signs Assessment: post-procedure vital signs reviewed and stable Respiratory status: spontaneous breathing and respiratory function stable Cardiovascular status: blood pressure returned to baseline Anesthetic complications: no   No notable events documented.   Last Vitals:  Vitals:   01/25/21 1204 01/25/21 1214  BP: 121/61 118/78  Pulse: (!) 49 (!) 52  Resp: 13 17  Temp:    SpO2: 97% 98%    Last Pain:  Vitals:   01/25/21 1214  TempSrc:   PainSc: 0-No pain                 VAN STAVEREN,Aaiden Depoy

## 2021-01-25 NOTE — Transfer of Care (Signed)
Immediate Anesthesia Transfer of Care Note  Patient: Cory Howard  Procedure(s) Performed: COLONOSCOPY WITH PROPOFOL  Patient Location: Endoscopy Unit  Anesthesia Type:General  Level of Consciousness: drowsy  Airway & Oxygen Therapy: Patient Spontanous Breathing  Post-op Assessment: Report given to RN and Post -op Vital signs reviewed and stable  Post vital signs: Reviewed and stable  Last Vitals:  Vitals Value Taken Time  BP 92/57 01/25/21 1144  Temp 35.9 C 01/25/21 1144  Pulse 52 01/25/21 1145  Resp 15 01/25/21 1145  SpO2 97 % 01/25/21 1145  Vitals shown include unvalidated device data.  Last Pain:  Vitals:   01/25/21 1144  TempSrc: Temporal  PainSc: 0-No pain         Complications: No notable events documented.

## 2021-01-25 NOTE — Interval H&P Note (Signed)
History and Physical Interval Note: Preprocedure H&P from 01/25/21  was reviewed and there was no interval change after seeing and examining the patient.  Written consent was obtained from the patient after discussion of risks, benefits, and alternatives. Patient has consented to proceed with Colonoscopy with possible intervention   01/25/2021 11:08 AM  Cory Howard  has presented today for surgery, with the diagnosis of Personal hx of polyps (Z86.010).  The various methods of treatment have been discussed with the patient and family. After consideration of risks, benefits and other options for treatment, the patient has consented to  Procedure(s): COLONOSCOPY WITH PROPOFOL (N/A) as a surgical intervention.  The patient's history has been reviewed, patient examined, no change in status, stable for surgery.  I have reviewed the patient's chart and labs.  Questions were answered to the patient's satisfaction.     Annamaria Helling

## 2021-01-26 ENCOUNTER — Encounter: Payer: Self-pay | Admitting: Gastroenterology

## 2021-01-26 LAB — SURGICAL PATHOLOGY

## 2022-01-28 IMAGING — CT CT RENAL STONE PROTOCOL
2 of 4 series · 15 of 46 positions shown, 17 images · non-contrast
Comparison: None.

CLINICAL DATA: Flank pain. Kidney stones suspected. Left-sided
pain.

EXAM:
CT ABDOMEN AND PELVIS WITHOUT CONTRAST
TECHNIQUE: Multidetector CT imaging of the abdomen and pelvis was performed
following the standard protocol without IV contrast.

[Series 2: stone full standard · axial · 0.82mm/px · z∈[-534,-99]mm · 12 of 95 slices shown, 14 images]
[im 4/95  soft-tissue]
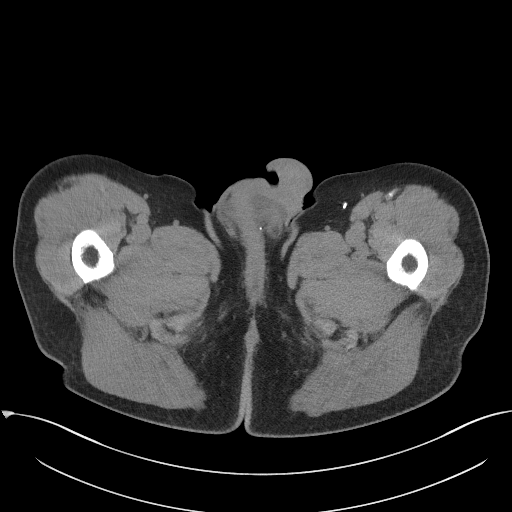
[im 4/95  bone]
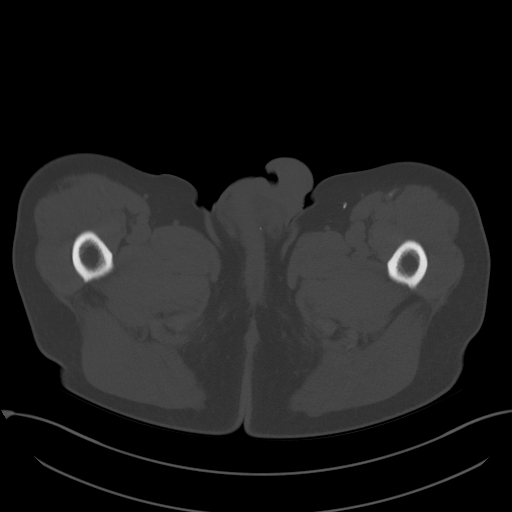
[im 12/95  soft-tissue]
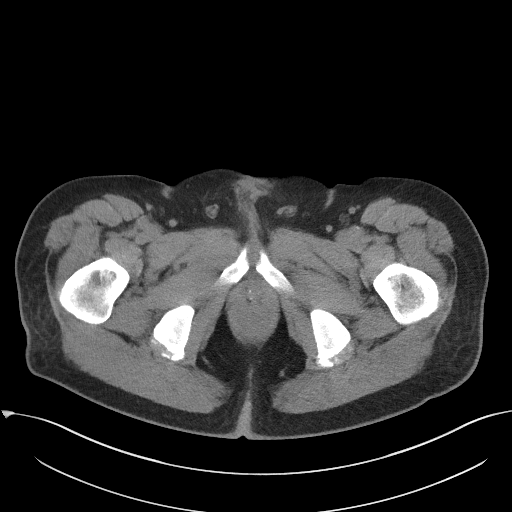
[im 20/95  soft-tissue]
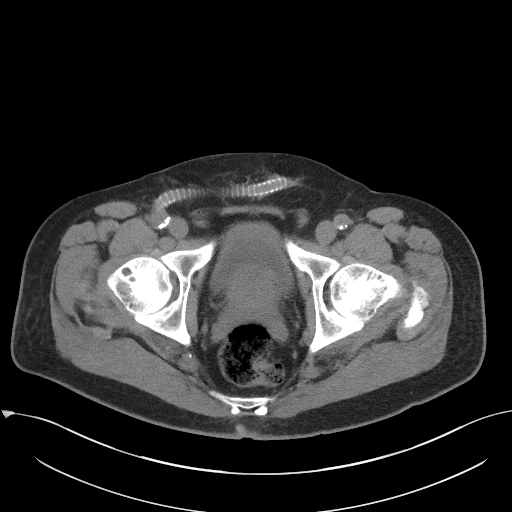
[im 28/95  soft-tissue]
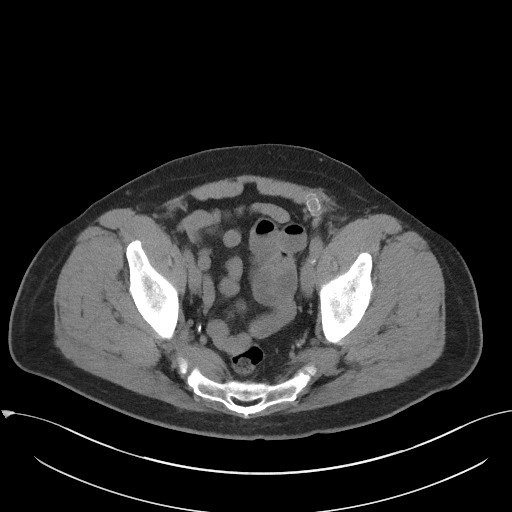
[im 36/95  soft-tissue]
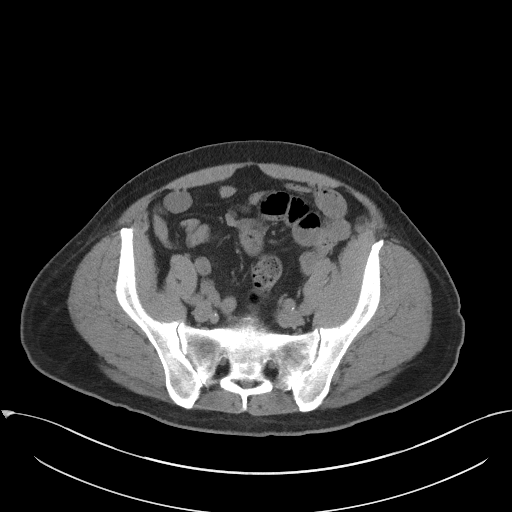
[im 44/95  soft-tissue]
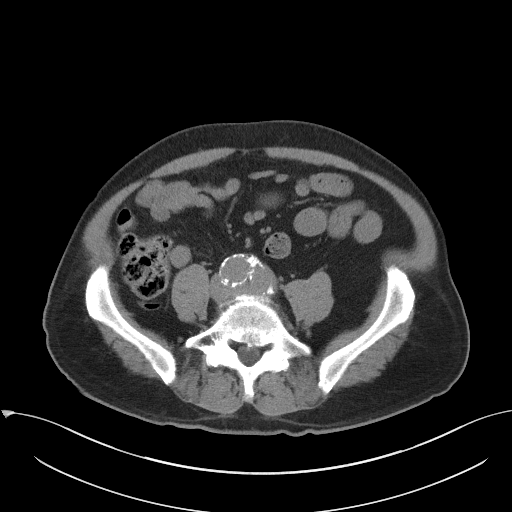
[im 51/95  soft-tissue]
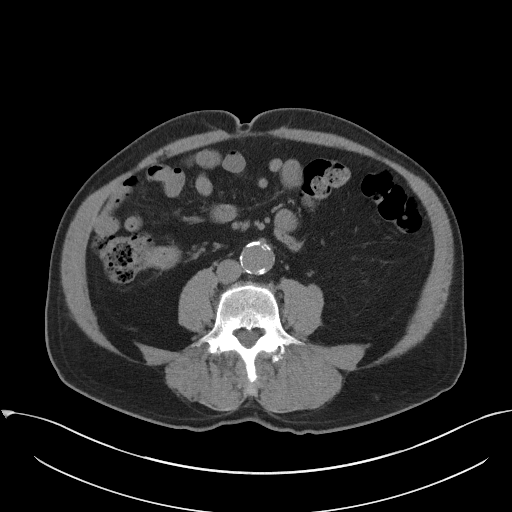
[im 59/95  soft-tissue]
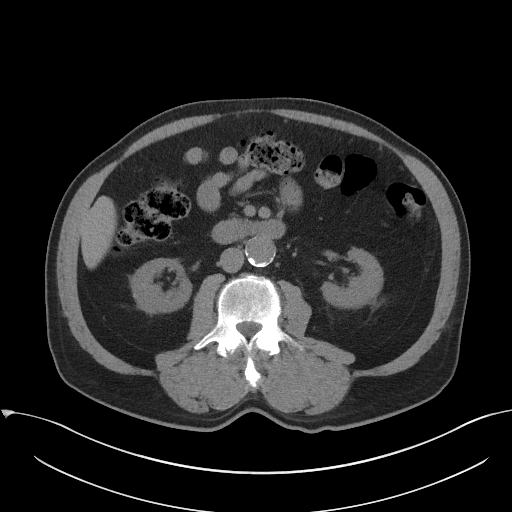
[im 67/95  soft-tissue]
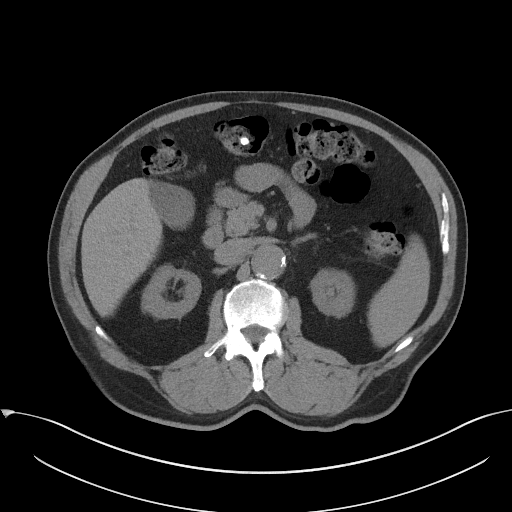
[im 67/95  bone]
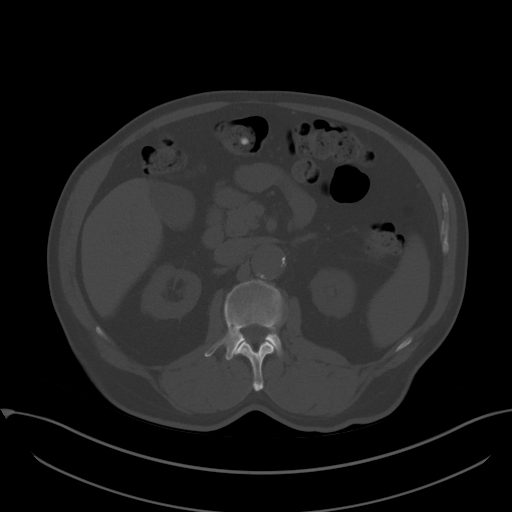
[im 75/95  soft-tissue]
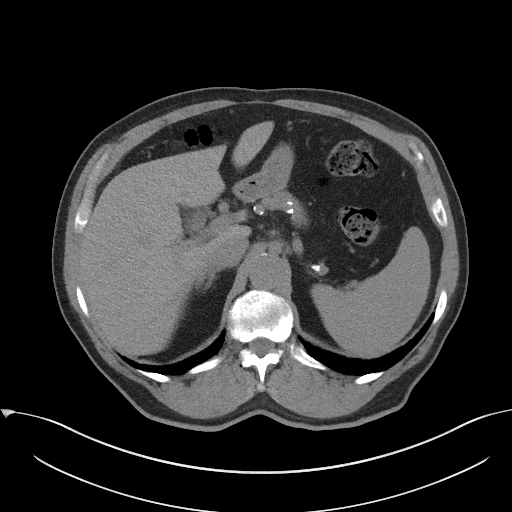
[im 83/95  soft-tissue]
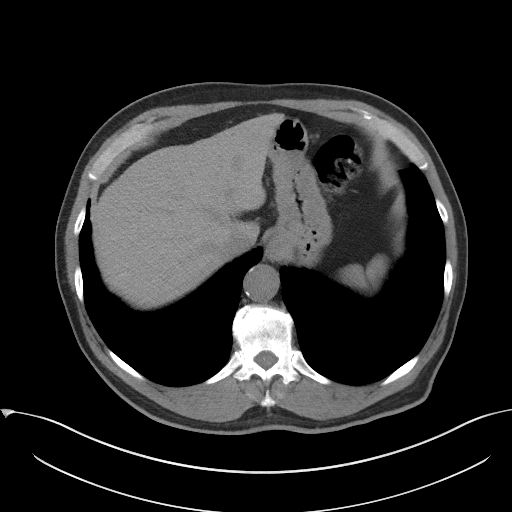
[im 91/95  soft-tissue]
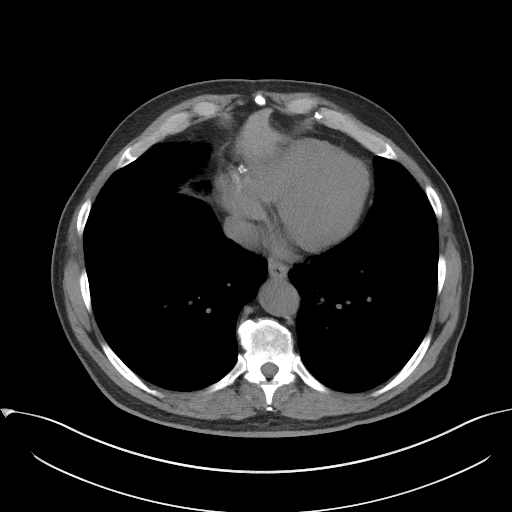

[Series 5: coronal · coronal · 0.79mm/px · 3 of 142 slices shown]
[im 48/142  soft-tissue]
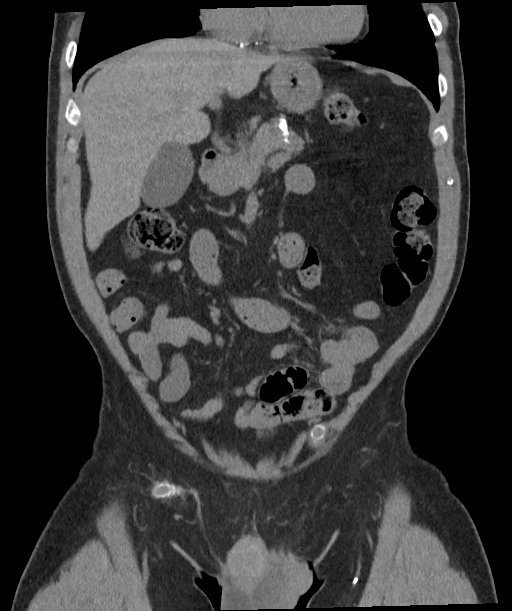
[im 63/142  soft-tissue]
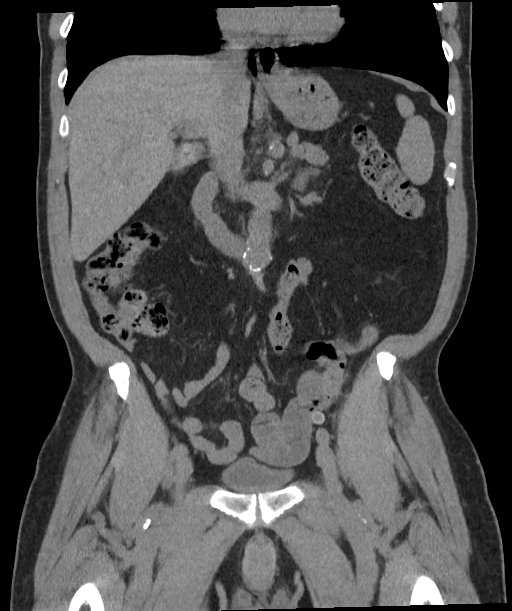
[im 79/142  soft-tissue]
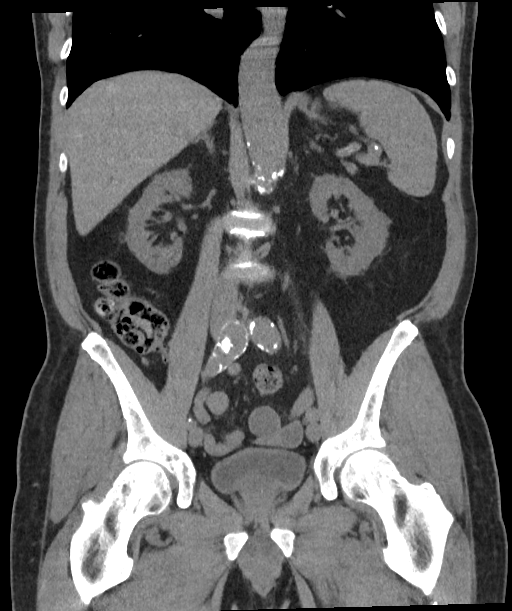

[15 of 46 positions shown; findings below may reference images not displayed]

FINDINGS: Lower chest: There is a 5 mm pulmonary nodule at the inferior right
lung base (axial series 4, image 21).The heart size is normal.

Hepatobiliary: The liver is normal. Cholelithiasis without acute
inflammation.There is no biliary ductal dilation.

Pancreas: Normal contours without ductal dilatation. No
peripancreatic fluid collection.

Spleen: No splenic laceration or hematoma.

Adrenals/Urinary Tract:

--Adrenal glands: No adrenal hemorrhage.

--Right kidney/ureter: No hydronephrosis or perinephric hematoma.

--Left kidney/ureter: No hydronephrosis or perinephric hematoma.

--Urinary bladder: The bladder wall is mildly thickened.

Stomach/Bowel:

--Stomach/Duodenum: No hiatal hernia or other gastric abnormality.
Normal duodenal course and caliber.

--Small bowel: No dilatation or inflammation.

--Colon: There is a moderate to large amount of stool throughout the
colon.

--Appendix: Normal.

Vascular/Lymphatic: There are atherosclerotic changes throughout the
visualized abdominal aorta. The infrarenal abdominal aorta measures
up to approximately 3 cm. The right common iliac artery is
aneurysmal measuring approximately 2.5 cm. The left common iliac
artery is aneurysmal measuring approximately 2.3 cm. There is an
iliofemoral bypass graft in place.

--No retroperitoneal lymphadenopathy.

--No mesenteric lymphadenopathy.

--No pelvic or inguinal lymphadenopathy.

Reproductive: The prostate gland is enlarged.

Other: No ascites or free air. The abdominal wall is normal.

Musculoskeletal. No acute displaced fractures. There is a large
posterior disc osteophyte complex at the L3-L4 level. There is
ligamentum flavum hypertrophy at this level contributing to spinal
canal stenosis.
IMPRESSION: 1. No CT findings in the abdomen or pelvis to explain the patient's
left flank pain.
2. There is a moderate to large amount of stool throughout the colon
could be seen in the setting of constipation.
3. There is a 3 cm infrarenal abdominal aortic aneurysm with
aneurysmal dilatation of the bilateral common iliac arteries.
Recommend followup by ultrasound in 3 years. This recommendation
follows ACR consensus guidelines: White Paper of the ACR Incidental
Findings Committee II on Vascular Findings. [HOSPITAL] 1347;
4. The bladder wall is mildly thickened. This is favored to be
secondary to chronic outlet obstruction, however correlation with
urinalysis is recommended.
5. Cholelithiasis without evidence of acute cholecystitis.
6. Prostatomegaly.
7. There is a 5 mm pulmonary nodule at the inferior right lung base.
No follow-up needed if patient is low-risk. Non-contrast chest CT
can be considered in 12 months if patient is high-risk. This
recommendation follows the consensus statement: Guidelines for
Management of Incidental Pulmonary Nodules Detected on CT Images:
8. There are degenerative changes throughout the lumbar spine with a
large posterior disc osteophyte complex at the L3-L4 level, likely
resulting in spinal canal stenosis.
9. Aortic Atherosclerosis ([id number].L).

## 2022-10-13 ENCOUNTER — Other Ambulatory Visit (INDEPENDENT_AMBULATORY_CARE_PROVIDER_SITE_OTHER): Payer: Self-pay | Admitting: Vascular Surgery

## 2022-10-13 DIAGNOSIS — I739 Peripheral vascular disease, unspecified: Secondary | ICD-10-CM

## 2022-10-13 DIAGNOSIS — I6523 Occlusion and stenosis of bilateral carotid arteries: Secondary | ICD-10-CM

## 2022-10-14 ENCOUNTER — Ambulatory Visit (INDEPENDENT_AMBULATORY_CARE_PROVIDER_SITE_OTHER): Payer: Medicare Other

## 2022-10-14 ENCOUNTER — Ambulatory Visit (INDEPENDENT_AMBULATORY_CARE_PROVIDER_SITE_OTHER): Payer: Medicare Other | Admitting: Vascular Surgery

## 2022-10-14 DIAGNOSIS — I6523 Occlusion and stenosis of bilateral carotid arteries: Secondary | ICD-10-CM

## 2022-10-14 DIAGNOSIS — I739 Peripheral vascular disease, unspecified: Secondary | ICD-10-CM

## 2022-10-14 DIAGNOSIS — Z9889 Other specified postprocedural states: Secondary | ICD-10-CM

## 2022-10-21 ENCOUNTER — Ambulatory Visit (INDEPENDENT_AMBULATORY_CARE_PROVIDER_SITE_OTHER): Payer: Medicare Other | Admitting: Vascular Surgery

## 2022-10-21 ENCOUNTER — Encounter (INDEPENDENT_AMBULATORY_CARE_PROVIDER_SITE_OTHER): Payer: Self-pay | Admitting: Vascular Surgery

## 2022-10-21 VITALS — BP 133/77 | HR 53 | Resp 16 | Wt 187.0 lb

## 2022-10-21 DIAGNOSIS — E785 Hyperlipidemia, unspecified: Secondary | ICD-10-CM

## 2022-10-21 DIAGNOSIS — I70211 Atherosclerosis of native arteries of extremities with intermittent claudication, right leg: Secondary | ICD-10-CM | POA: Diagnosis not present

## 2022-10-21 DIAGNOSIS — I10 Essential (primary) hypertension: Secondary | ICD-10-CM

## 2022-10-21 DIAGNOSIS — I6523 Occlusion and stenosis of bilateral carotid arteries: Secondary | ICD-10-CM

## 2022-10-21 NOTE — Progress Notes (Signed)
MRN : [id number]  Cory Howard is a 82 y.o. (September 04, 1940) male who presents with chief complaint of  Chief Complaint  Patient presents with   Follow-up    Ultrasound results  .  History of Present Illness: Patient returns today in follow up of multiple vascular issues.  He is over a decade status post femoral to femoral bypass for disabling claudication symptoms.  He still has some claudication symptoms particularly in the hip and buttock area but these are stable and not significantly worse.  No rest pain.  No ulceration.  Recent noninvasive study showed normal ABIs bilaterally consistent with no significant arterial insufficiency status post revascularization. He is also status post left carotid endarterectomy for high-grade carotid stenosis in the past.  No recent focal neurologic symptoms. Specifically, the patient denies amaurosis fugax, speech or swallowing difficulties, or arm or leg weakness or numbness. Duplex shows a widely patent left carotid endarterectomy with minimal right carotid stenosis.   Current Outpatient Medications  Medication Sig Dispense Refill   acetaminophen (TYLENOL) 500 MG tablet Take 2 tablets (1,000 mg total) by mouth every 6 (six) hours. 30 tablet 0   allopurinol (ZYLOPRIM) 300 MG tablet Take 300 mg by mouth daily.     amLODipine (NORVASC) 10 MG tablet Take 10 mg by mouth daily.     aspirin EC 81 MG tablet Take 81 mg by mouth daily. Swallow whole.     Cholecalciferol (D3-1000 PO) Take 1 tablet by mouth daily.     gabapentin (NEURONTIN) 100 MG capsule Take by mouth.     Garlic-Parsley 40-9 MG CAPS Take by mouth.     lisinopril (ZESTRIL) 2.5 MG tablet Take 2.5 mg by mouth daily.     metoprolol tartrate (LOPRESSOR) 25 MG tablet Take 25 mg by mouth daily as needed.     omega-3 acid ethyl esters (LOVAZA) 1 g capsule Take 1 g by mouth daily.      omeprazole (PRILOSEC) 20 MG capsule Take 20 mg by mouth daily.     simvastatin (ZOCOR) 40 MG tablet Take 40 mg by  mouth daily.     tamsulosin (FLOMAX) 0.4 MG CAPS capsule Take 0.4 mg by mouth daily.      No current facility-administered medications for this visit.   Facility-Administered Medications Ordered in Other Visits  Medication Dose Route Frequency Provider Last Rate Last Admin   0.9 %  sodium chloride infusion  250 mL Intravenous PRN Dalia Heading, MD       sodium chloride flush (NS) 0.9 % injection 3 mL  3 mL Intravenous Q12H Dalia Heading, MD       sodium chloride flush (NS) 0.9 % injection 3 mL  3 mL Intravenous PRN Dalia Heading, MD        Past Medical History:  Diagnosis Date   AAA (abdominal aortic aneurysm) (HCC)    3 cm   Anemia    BPH (benign prostatic hyperplasia)    Carotid arterial disease (HCC)    Cholelithiasis    Complication of anesthesia    was semi awake during surgery   Coronary artery disease    GERD (gastroesophageal reflux disease)    Gout    Gout    Hallux rigidus    History of shingles    HLD (hyperlipidemia)    Hypertension    Lipids serum increased    Lumbar herniated disc 06/28/2019   Lumbar radiculopathy, acute 06/28/2019   Myocardial infarction (HCC)  Peripheral vascular disease (HCC)    Plantar fascial fibromatosis    Pulmonary nodule 06/2019   seen on mri, just observing for now   Renal insufficiency    CKD stage 3b   Urine protein increased    Vitamin D deficiency     Past Surgical History:  Procedure Laterality Date   ADENATOMOUS COLON POLYP     CARDIAC CATHETERIZATION Left 02/23/2016   Procedure: Left Heart Cath and Coronary Angiography;  Surgeon: Dalia Heading, MD;  Location: [facility] INV[facility]IVE CV [facility];  Service: Cardiovascular;  Laterality: Left;   CARDIAC CATHETERIZATION Right 02/23/2016   Procedure: Bypass Graft Angiography;  Surgeon: Dalia Heading, MD;  Location: [facility];  Service: Cardiovascular;  Laterality: Right;   CAROTID ENDARTERECTOMY Left    COLONOSCOPY     COLONOSCOPY WITH PROPOFOL N/A 03/20/2015    Procedure: COLONOSCOPY WITH PROPOFOL;  Surgeon: Scot Jun, MD;  Location: [facility] ENDOSCOPY;  Service: Endoscopy;  Laterality: N/A;   COLONOSCOPY WITH PROPOFOL N/A 01/25/2021   Procedure: COLONOSCOPY WITH PROPOFOL;  Surgeon: Jaynie Collins, DO;  Location: [facility] ENDOSCOPY;  Service: Gastroenterology;  Laterality: N/A;   COLONOSCOPY, ESOPHAGOGASTRODUODENOSCOPY (EGD) AND ESOPHAGEAL DILATION     CORONARY ANGIOPLASTY     CORONARY ARTERY BYPASS GRAFT  2012   EYE SURGERY Left    cataract extraction   FEMORAL ARTERY - FEMORAL ARTERY BYPASS GRAFT     LUMBAR LAMINECTOMY/DECOMPRESSION MICRODISCECTOMY Bilateral 07/15/2019   Procedure: OPEN L4 LAMINECTOMY, L3/4 DISCECTOMY;  Surgeon: Lucy Chris, MD;  Location: [facility] ORS;  Service: Neurosurgery;  Laterality: Bilateral;     Social History   Tobacco Use   Smoking status: Former    Current packs/day: 0.00    Types: Cigarettes    Quit date: 07/30/1983    Years since quitting: 39.2   Smokeless tobacco: Never  Vaping Use   Vaping status: Never Used  Substance Use Topics   Alcohol use: No   Drug use: No      Family History  Problem Relation Age of Onset   Cancer Mother    Heart disease Mother    Kidney cancer Neg Hx    Kidney disease Neg Hx    Prostate cancer Neg Hx      Allergies  Allergen Reactions   Morphine And Codeine Other (See Comments)    psychosis    REVIEW OF SYSTEMS (Negative unless checked)   Constitutional: [] Weight loss  [] Fever  [] Chills Cardiac: [] Chest pain   [] Chest pressure   [] Palpitations   [] Shortness of breath when laying flat   [] Shortness of breath at rest   [] Shortness of breath with exertion. Vascular:  [x] Pain in legs with walking   [] Pain in legs at rest   [] Pain in legs when laying flat   [x] Claudication   [] Pain in feet when walking  [] Pain in feet at rest  [] Pain in feet when laying flat   [] History of DVT   [] Phlebitis   [] Swelling in legs   [] Varicose veins   [] Non-healing ulcers Pulmonary:    [] Uses home oxygen   [] Productive cough   [] Hemoptysis   [] Wheeze  [] COPD   [] Asthma Neurologic:  [] Dizziness  [] Blackouts   [] Seizures   [] History of stroke   [] History of TIA  [] Aphasia   [] Temporary blindness   [] Dysphagia   [] Weakness or numbness in arms   [] Weakness or numbness in legs Musculoskeletal:  [x] Arthritis   [] Joint swelling   [] Joint pain   [x] Low back pain Hematologic:  []   Easy bruising  [] Easy bleeding   [] Hypercoagulable state   [] Anemic  [] Hepatitis Gastrointestinal:  [] Blood in stool   [] Vomiting blood  [] Gastroesophageal reflux/heartburn   [] Difficulty swallowing. Genitourinary:  [] Chronic kidney disease   [] Difficult urination  [] Frequent urination  [] Burning with urination   [] Blood in urine Skin:  [] Rashes   [] Ulcers   [] Wounds Psychological:  [] History of anxiety   []  History of major depression.  Physical Examination  BP 133/77 (BP Location: Left Arm)   Pulse (!) 53   Resp 16   Wt 187 lb (84.8 kg)   BMI 28.43 kg/m  Gen:  WD/WN, NAD.  Appears younger than stated age Head: [place]/AT, No temporalis wasting. Ear/Nose/Throat: Hearing grossly intact, nares w/o erythema or drainage Eyes: Conjunctiva clear. Sclera non-icteric Neck: Supple.  Trachea midline Pulmonary:  Good air movement, no use of accessory muscles.  Cardiac: bradycardic Vascular:  Vessel Right Left  Radial Palpable Palpable                          PT Palpable Palpable  DP Palpable Palpable   Gastrointestinal: soft, non-tender/non-distended. No guarding/reflex.  Musculoskeletal: M/S 5/5 throughout.  No deformity or atrophy.  No significant lower extremity edema. Neurologic: Sensation grossly intact in extremities.  Symmetrical.  Speech is fluent.  Psychiatric: Judgment intact, Mood & affect appropriate for pt's clinical situation. Dermatologic: No rashes or ulcers noted.  No cellulitis or open wounds.      Labs Recent Results (from the past 2160 hour(s))  VAS [place] ABI WITH/WO TBI      Status: None   Collection Time: 10/14/22  9:19 AM  Result Value Ref Range   Right ABI 1.12    Left ABI 1.23     Radiology VAS [place] FEMORAL-FEMORAL BYPASS GRAFT  Result Date: 10/21/2022 LOWER EXTREMITY ARTERIAL DUPLEX STUDY Patient Name:  Cory Howard  Date of Exam:   10/14/2022 Medical Rec #: [id number]        Accession #:    [id number] Date of Birth: [birth year]         Patient Gender: M Patient Age:   27 years Exam Location:  [facility] Procedure:      VAS [place] FEMORAL-FEMORAL BYPASS GRAFT Referring Phys: Festus Barren --------------------------------------------------------------------------------  Indications: Peripheral artery disease.  Vascular Interventions: Left-Right Fem-Fem BPG in 2012. Current ABI:            Right=1.12 & Left=1.23 Performing Technologist: Jamse Mead RT, RDMS, RVT  Examination Guidelines: A complete evaluation includes B-mode imaging, spectral Doppler, color Doppler, and power Doppler as needed of all accessible portions of each vessel. Bilateral testing is considered an integral part of a complete examination. Limited examinations for reoccurring indications may be performed as noted. Aorta: +------+-------+----------+----------+--------+--------+--------+       AP (cm)Trans (cm)PSV (cm/s)WaveformThrombusShape    +------+-------+----------+----------+--------+--------+--------+ Distal3.01   2.87      55        biphasic        fusiform +------+-------+----------+----------+--------+--------+--------+  Fem Fem Graft: +------------------+--------+--------+---------+--------+                   PSV cm/sStenosisWaveform Comments +------------------+--------+--------+---------+--------+ Inflow            162             triphasic         +------------------+--------+--------+---------+--------+ Prox anastomosis  142             triphasic         +------------------+--------+--------+---------+--------+  Proximal graft    171              triphasic         +------------------+--------+--------+---------+--------+ Mid graft         147             triphasic         +------------------+--------+--------+---------+--------+ Distal graft      143             triphasic         +------------------+--------+--------+---------+--------+ Distal anastomosis208             triphasic         +------------------+--------+--------+---------+--------+ Outflow           137             biphasic          +------------------+--------+--------+---------+--------+  +--------------+-------+-----------+--------+--------+-----+--------+ Left PoplitealAP (cm)Transv (cm)WaveformStenosisShapeComments +--------------+-------+-----------+--------+--------+-----+--------+ Distal                                                        +--------------+-------+-----------+--------+--------+-----+--------+  +--------------+--------+-----+--------+---------+-----------+ LEFT          PSV cm/sRatioStenosisWaveform Comments    +--------------+--------+-----+--------+---------+-----------+ CIA Prox-Mid  80                   biphasic 2.0 x 1.9cm +--------------+--------+-----+--------+---------+-----------+ EIA Mid-Distal162                  triphasic            +--------------+--------+-----+--------+---------+-----------+  Summary: Patent left to right femoral-femoral bypass graft and left aortoiliac system with no significant stenosis. Incidental finding: Aneurysmal/ectatic dilatation of the distal abdominal aorta and left proximal/mid CIA noted.  See table(s) above for measurements and observations. Electronically signed by Festus Barren MD on 10/21/2022 at 12:07:59 PM.    Final    VAS US CAROTID  Result Date: 10/21/2022 Carotid Arterial Duplex Study Patient Name:  Cory Howard  Date of Exam:   10/14/2022 Medical Rec #: [id number]        Accession #:    [id number] Date of Birth: 11/15/1940         Patient Gender: M Patient  Age:   82 years Exam Location:  [facility] Procedure:      VAS US CAROTID Referring Phys: Festus Barren --------------------------------------------------------------------------------  Indications:   Carotid artery disease. Other Factors: H/O left CEA. Performing Technologist: Jamse Mead RT, RDMS, RVT  Examination Guidelines: A complete evaluation includes B-mode imaging, spectral Doppler, color Doppler, and power Doppler as needed of all accessible portions of each vessel. Bilateral testing is considered an integral part of a complete examination. Limited examinations for reoccurring indications may be performed as noted.  Right Carotid Findings: +----------+--------+--------+--------+------------------+--------+           PSV cm/sEDV cm/sStenosisPlaque DescriptionComments +----------+--------+--------+--------+------------------+--------+ CCA Prox  65      13                                         +----------+--------+--------+--------+------------------+--------+ CCA Mid   82      17                                         +----------+--------+--------+--------+------------------+--------+  CCA Distal67      13                                         +----------+--------+--------+--------+------------------+--------+ ICA Prox  63      14      1-39%   heterogenous               +----------+--------+--------+--------+------------------+--------+ ICA Mid   65      19                                         +----------+--------+--------+--------+------------------+--------+ ICA Distal54      15                                         +----------+--------+--------+--------+------------------+--------+ ECA       71      9                                          +----------+--------+--------+--------+------------------+--------+ +----------+--------+-------+----------------+-------------------+           PSV cm/sEDV cmsDescribe        Arm  Pressure (mmHG) +----------+--------+-------+----------------+-------------------+ [id number]            Multiphasic, WNL                    +----------+--------+-------+----------------+-------------------+ +---------+--------+--+--------+---------+ VertebralPSV cm/s31EDV cm/sAntegrade +---------+--------+--+--------+---------+ Left Carotid Findings: +----------+--------+--------+--------+------------------+------------------+           PSV cm/sEDV cm/sStenosisPlaque DescriptionComments           +----------+--------+--------+--------+------------------+------------------+ CCA Prox  56      12                                                   +----------+--------+--------+--------+------------------+------------------+ CCA Mid   79      19                                                   +----------+--------+--------+--------+------------------+------------------+ CCA Distal65      11                                intimal thickening +----------+--------+--------+--------+------------------+------------------+ ICA Prox  54      17                                                   +----------+--------+--------+--------+------------------+------------------+ ICA Mid   51      16                                                   +----------+--------+--------+--------+------------------+------------------+  ICA Distal57      15                                                   +----------+--------+--------+--------+------------------+------------------+ ECA       57      7                                                    +----------+--------+--------+--------+------------------+------------------+ +----------+--------+--------+----------------+-------------------+           PSV cm/sEDV cm/sDescribe        Arm Pressure (mmHG) +----------+--------+--------+----------------+-------------------+ [id number]             Multiphasic, WNL                     +----------+--------+--------+----------------+-------------------+ +---------+--------+--+--------+---------+ VertebralPSV cm/s64EDV cm/sAntegrade +---------+--------+--+--------+---------+  Summary: Right Carotid: The extracranial vessels were near-normal with only minimal wall                thickening or plaque. Left Carotid: The extracranial vessels were near-normal with only minimal wall               thickening or plaque. *See table(s) above for measurements and observations.  No significant change when compared to the previous exam on 10/16/20. Electronically signed by Festus Barren MD on 10/21/2022 at 12:06:40 PM.    Final    VAS [place] ABI WITH/WO TBI  Result Date: 10/21/2022  LOWER EXTREMITY DOPPLER STUDY Patient Name:  Cory Howard  Date of Exam:   10/14/2022 Medical Rec #: [id number]        Accession #:    [id number] Date of Birth: 06-Sep-1940         Patient Gender: M Patient Age:   13 years Exam Location:  [facility] Procedure:      VAS [place] ABI WITH/WO TBI Referring Phys: Barbara Cower  --------------------------------------------------------------------------------  Indications: Peripheral artery disease.  Vascular Interventions: Left-Right Fem-Fem BPG in 2012;. Performing Technologist: Jamse Mead RT, RDMS, RVT  Examination Guidelines: A complete evaluation includes at minimum, Doppler waveform signals and systolic blood pressure reading at the level of bilateral brachial, anterior tibial, and posterior tibial arteries, when vessel segments are accessible. Bilateral testing is considered an integral part of a complete examination. Photoelectric Plethysmograph (PPG) waveforms and toe systolic pressure readings are included as required and additional duplex testing as needed. Limited examinations for reoccurring indications may be performed as noted.  ABI Findings: +---------+------------------+-----+--------+--------+ Right    Rt Pressure (mmHg)IndexWaveformComment   +---------+------------------+-----+--------+--------+ Brachial 137                                     +---------+------------------+-----+--------+--------+ PTA      154               1.12 biphasic         +---------+------------------+-----+--------+--------+ DP       143               1.04 biphasic         +---------+------------------+-----+--------+--------+ Tawni Pummel  0.72 Normal           +---------+------------------+-----+--------+--------+ +---------+------------------+-----+---------+-------+ Left     Lt Pressure (mmHg)IndexWaveform Comment +---------+------------------+-----+---------+-------+ Brachial 134                                     +---------+------------------+-----+---------+-------+ PTA      169               1.23 triphasic        +---------+------------------+-----+---------+-------+ DP       145               1.06 biphasic         +---------+------------------+-----+---------+-------+ Great Toe110               0.80 Normal           +---------+------------------+-----+---------+-------+ +-------+-----------+-----------+------------+------------+ ABI/TBIToday's ABIToday's TBIPrevious ABIPrevious TBI +-------+-----------+-----------+------------+------------+ Right  1.12       0.72       1.10        0.91         +-------+-----------+-----------+------------+------------+ Left   1.23       0.80       1.16        0.84         +-------+-----------+-----------+------------+------------+ Exercise: The patient performed toe-ups for 2 minutes. After exercise the right ABI did not change. After exercise the left ABI did not change.  Bilateral ABIs appear essentially unchanged compared to prior study on 10/16/20.  Summary: Bilateral: Bilateral ankle-brachial indexes are within normal range at rest and following exercise. Bilateral toe-brachial indexes are within normal range.  *See table(s) above for measurements  and observations.  Electronically signed by Festus Barren MD on 10/21/2022 at 12:06:28 PM.    Final     Assessment/Plan  Atherosclerosis of native arteries of extremity with intermittent claudication (HCC) Recent noninvasive study showed normal ABIs bilaterally consistent with no significant arterial insufficiency status post revascularization.  Since it has been over a decade since his surgery, we have been checking this every other year and we will continue this.  Continue current medical regimen.  Carotid stenosis Duplex shows a widely patent left carotid endarterectomy with minimal right carotid stenosis.  Continue current medicines.  Checking every other year.  Essential hypertension, benign blood pressure control important in reducing the progression of atherosclerotic disease. On appropriate oral medications.   Hyperlipidemia lipid control important in reducing the progression of atherosclerotic disease. Continue statin therapy    Festus Barren, MD  10/21/2022 12:12 PM    This note was created with Dragon medical transcription system.  Any errors from dictation are purely unintentional

## 2022-10-21 NOTE — Assessment & Plan Note (Signed)
blood pressure control important in reducing the progression of atherosclerotic disease. On appropriate oral medications.  

## 2022-10-21 NOTE — Assessment & Plan Note (Signed)
lipid control important in reducing the progression of atherosclerotic disease. Continue statin therapy  

## 2022-10-21 NOTE — Assessment & Plan Note (Signed)
Recent noninvasive study showed normal ABIs bilaterally consistent with no significant arterial insufficiency status post revascularization.  Since it has been over a decade since his surgery, we have been checking this every other year and we will continue this.  Continue current medical regimen.

## 2022-10-21 NOTE — Assessment & Plan Note (Signed)
Duplex shows a widely patent left carotid endarterectomy with minimal right carotid stenosis.  Continue current medicines.  Checking every other year.
